# Patient Record
Sex: Female | Born: 1995 | Race: Black or African American | Hispanic: No | Marital: Married | Smoking: Never smoker
Health system: Southern US, Community
[De-identification: ages and names within clinical notes are randomized; demographics above are authoritative.]

## PROBLEM LIST (undated history)

## (undated) ENCOUNTER — Inpatient Hospital Stay (HOSPITAL_COMMUNITY): Payer: Self-pay

## (undated) DIAGNOSIS — Z789 Other specified health status: Secondary | ICD-10-CM

## (undated) HISTORY — DX: Other specified health status: Z78.9

## (undated) HISTORY — PX: NO PAST SURGERIES: SHX2092

---
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
## 2016-01-05 ENCOUNTER — Ambulatory Visit (INDEPENDENT_AMBULATORY_CARE_PROVIDER_SITE_OTHER): Payer: Medicaid Other | Admitting: *Deleted

## 2016-01-05 DIAGNOSIS — Z3042 Encounter for surveillance of injectable contraceptive: Secondary | ICD-10-CM | POA: Diagnosis not present

## 2016-01-05 DIAGNOSIS — Z111 Encounter for screening for respiratory tuberculosis: Secondary | ICD-10-CM | POA: Diagnosis not present

## 2016-01-05 LAB — POCT URINE PREGNANCY: PREG TEST UR: NEGATIVE

## 2016-01-05 MED ORDER — MEDROXYPROGESTERONE ACETATE 150 MG/ML IM SUSP
150.0000 mg | Freq: Once | INTRAMUSCULAR | Status: AC
  Administered 2016-01-05: 150 mg via INTRAMUSCULAR

## 2016-01-05 NOTE — Progress Notes (Signed)
   Pt late for Depo Provera injection. Pregnancy test ordered; result negative.  Pt tolerated Depo injection. Depo given right upper outer quadrant.  Next injection due Oct 27-Nov. 10, 2017.  Reminder card given.   PPD placed Left Forearm.  Pt to return Monday 01/08/16 for reading.  Pt tolerated intradermal injection. Clovis PuMartin, Felicia Both L, RN

## 2016-01-08 ENCOUNTER — Ambulatory Visit (INDEPENDENT_AMBULATORY_CARE_PROVIDER_SITE_OTHER): Payer: Medicaid Other | Admitting: *Deleted

## 2016-01-08 DIAGNOSIS — Z7689 Persons encountering health services in other specified circumstances: Secondary | ICD-10-CM

## 2016-01-08 DIAGNOSIS — Z111 Encounter for screening for respiratory tuberculosis: Secondary | ICD-10-CM

## 2016-01-08 NOTE — Progress Notes (Signed)
PPD Reading Note  PPD read and results entered in EpicCare.  Result: 0 mm induration.  Interpretation: Negative  If test not read within 48-72 hours of initial placement, patient advised to repeat in other arm 1-3 weeks after this test.  Allergic reaction: no

## 2016-04-01 ENCOUNTER — Ambulatory Visit: Payer: Medicaid Other

## 2016-04-04 ENCOUNTER — Ambulatory Visit (INDEPENDENT_AMBULATORY_CARE_PROVIDER_SITE_OTHER): Payer: Medicaid Other | Admitting: *Deleted

## 2016-04-04 DIAGNOSIS — Z3040 Encounter for surveillance of contraceptives, unspecified: Secondary | ICD-10-CM

## 2016-04-04 DIAGNOSIS — Z3042 Encounter for surveillance of injectable contraceptive: Secondary | ICD-10-CM

## 2016-04-04 LAB — POCT URINE PREGNANCY: Preg Test, Ur: NEGATIVE

## 2016-04-04 MED ORDER — MEDROXYPROGESTERONE ACETATE 150 MG/ML IM SUSP
150.0000 mg | Freq: Once | INTRAMUSCULAR | Status: AC
  Administered 2016-04-04: 150 mg via INTRAMUSCULAR

## 2016-04-04 NOTE — Progress Notes (Signed)
   Pt late for Depo Provera injection.  Pregnancy test ordered; results Negative. Patient advised to take another pregnancy test at home or schedule an appointment in clinic next week.  Pt tolerated Depo injection. Depo given left upper outer quadrant.  Next injection due Jan. 25-Feb. 9, 2018.  Reminder card given. Clovis PuMartin, Tamika L, RN

## 2016-05-09 ENCOUNTER — Emergency Department (HOSPITAL_COMMUNITY)
Admission: EM | Admit: 2016-05-09 | Discharge: 2016-05-09 | Disposition: A | Discharge status: Home / Self Care | Payer: Medicaid Other | Attending: Emergency Medicine | Admitting: Emergency Medicine

## 2016-05-09 ENCOUNTER — Emergency Department (HOSPITAL_COMMUNITY): Payer: Medicaid Other

## 2016-05-09 ENCOUNTER — Encounter (HOSPITAL_COMMUNITY): Payer: Self-pay | Admitting: Emergency Medicine

## 2016-05-09 DIAGNOSIS — R55 Syncope and collapse: Secondary | ICD-10-CM

## 2016-05-09 DIAGNOSIS — Z79899 Other long term (current) drug therapy: Secondary | ICD-10-CM | POA: Insufficient documentation

## 2016-05-09 DIAGNOSIS — R0789 Other chest pain: Secondary | ICD-10-CM

## 2016-05-09 DIAGNOSIS — R079 Chest pain, unspecified: Secondary | ICD-10-CM | POA: Diagnosis present

## 2016-05-09 LAB — CBC
HCT: 42.1 % (ref 36.0–46.0)
Hemoglobin: 13.8 g/dL (ref 12.0–15.0)
MCH: 28.8 pg (ref 26.0–34.0)
MCHC: 32.8 g/dL (ref 30.0–36.0)
MCV: 87.9 fL (ref 78.0–100.0)
PLATELETS: 157 10*3/uL (ref 150–400)
RBC: 4.79 MIL/uL (ref 3.87–5.11)
RDW: 11.9 % (ref 11.5–15.5)
WBC: 4.2 10*3/uL (ref 4.0–10.5)

## 2016-05-09 LAB — BASIC METABOLIC PANEL
ANION GAP: 9 (ref 5–15)
BUN: 11 mg/dL (ref 6–20)
CHLORIDE: 107 mmol/L (ref 101–111)
CO2: 24 mmol/L (ref 22–32)
Calcium: 10.1 mg/dL (ref 8.9–10.3)
Creatinine, Ser: 0.91 mg/dL (ref 0.44–1.00)
GFR calc non Af Amer: >60 mL/min (ref >60)
Glucose, Bld: 84 mg/dL (ref 65–99)
POTASSIUM: 3.5 mmol/L (ref 3.5–5.1)
Sodium: 140 mmol/L (ref 135–145)

## 2016-05-09 LAB — I-STAT TROPONIN, ED: Troponin i, poc: 0 ng/mL (ref 0.00–0.08)

## 2016-05-09 LAB — I-STAT BETA HCG BLOOD, ED (MC, WL, AP ONLY): I-stat hCG, quantitative: <5 m[IU]/mL (ref <5)

## 2016-05-09 LAB — D-DIMER, QUANTITATIVE (NOT AT ARMC)

## 2016-05-09 MED ORDER — ONDANSETRON 4 MG PO TBDP
4.0000 mg | ORAL_TABLET | Freq: Once | ORAL | Status: DC
  Filled 2016-05-09: qty 1

## 2016-05-09 NOTE — ED Triage Notes (Signed)
Pt here after having episode of CP/anxiety with tingling all over; pt here for eval and sts still having some CP

## 2016-05-09 NOTE — ED Provider Notes (Signed)
[facility]-EMERG[facility]Y DEPT Provider Note   CSN: [id number] Arrival date & time: 05/09/16  1514  By signing my name below, I, Freida Busmaniana Omoyeni, attest that this documentation has been prepared under the direction and in the presence of Nira ConnPedro Eduardo Janely Gullickson, MD . Electronically Signed: Freida Busmaniana Omoyeni, Scribe. 05/09/2016. 4:44 PM.  History   Chief Complaint Chief Complaint  Patient presents with  . Anxiety  . Chest Pain    The history is provided by the patient and a parent. No language interpreter was used.     HPI Comments:  Margaret PerchesKashawna Pasquarelli is a 20 y.o. female who presents to the Emergency Department complaining of sudden onset, non-radiating, constant central CP which began this afternoon. 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 Pt states she felt hot and like she was going to pass out. She then began to feel like she was breathing funny. She felt like she was in a tunnel; the CP began shortly afterward.  She reports associated SOB at time of onset that has resolved. When the SOB resolved she noticed tingling in her extremities. Pt was evaluated by EMS at the time, they told the pt she was having a panic attack. Pt states she has not had a panic attack before. Pt notes she does not drink as much fluids as she should and she has only eaten a sandwich today.  Pt's mother reports personal h/o a flushed sensation when going long period of time between meals. Pt denies diarrhea, recent fever, cough, congestion, and dysuria. No recent surgeries or long periods of immobilization. Pt denies smoking hx, use of ETOH, and recreational drugs. No FHx of thyroid or cardiac issues. Pt is sexually active; last encounter was ~ 1 month ago. She is currently on depo and uses condoms.   Past Medical History:  Diagnosis Date  . Pilonidal cyst     Patient Active Problem List   Diagnosis Date Noted  . Screening for HIV (human immunodeficiency virus)  12/23/2014  . Depo contraception 12/23/2014  . Motion sickness 01/24/2014  . Menometrorrhagia 02/19/2013  . Well child check 01/08/2012  . Stye 06/27/2011  . Pilonidal cyst 05/15/2011  . Coccyx pain 05/07/2011    History reviewed. No pertinent surgical history.  OB History    No data available       Home Medications    Prior to Admission medications   Medication Sig Start Date End Date Taking? Authorizing Provider  Ibuprofen (ADVIL PO) Take by mouth.    Historical Provider, MD  medroxyPROGESTERone (DEPO-PROVERA) 150 MG/ML injection Inject 150 mg into the muscle every 3 (three) months.    Historical Provider, MD    Family History History reviewed. No pertinent family history.  Social History Social History  Substance Use Topics  . Smoking status: Never Smoker  . Smokeless tobacco: Never Used  . Alcohol use No     Allergies   Penicillins   Review of Systems Review of Systems 10 systems reviewed and all are negative for acute change except as noted in the HPI.  Physical Exam Updated Vital Signs BP 118/79 (BP Location: Right Arm)   Pulse 84   Temp 98.7 F (37.1 C) (Oral)   Resp 18   SpO2 100%   Physical Exam  Constitutional: She is oriented to person, place, and time. She appears well-developed and well-nourished. No distress.  HENT:  Head: Normocephalic and atraumatic.  Nose: Nose normal.  Eyes:  Conjunctivae and EOM are normal. Pupils are equal, round, and reactive to light. Right eye exhibits no discharge. Left eye exhibits no discharge. No scleral icterus.  Neck: Normal range of motion. Neck supple.  Cardiovascular: Normal rate and regular rhythm.  Exam reveals no gallop and no friction rub.   No murmur heard. Pulmonary/Chest: Effort normal and breath sounds normal. No stridor. No respiratory distress. She has no rales.  Abdominal: Soft. She exhibits no distension. There is no tenderness.  Musculoskeletal: She exhibits no edema or tenderness.    Neurological: She is alert and oriented to person, place, and time.  Skin: Skin is warm and dry. No rash noted. She is not diaphoretic. No erythema.  Psychiatric: She has a normal mood and affect.  Nursing note and vitals reviewed.    ED Treatments / Results  DIAGNOSTIC STUDIES:  Oxygen Saturation is 100% on RA, normal by my interpretation.    COORDINATION OF CARE:  4:42 PM Discussed treatment plan with pt at bedside and pt agreed to plan.   Labs (all labs ordered are listed, but only abnormal results are displayed) Labs Reviewed  CBC  BASIC METABOLIC PANEL  D-DIMER, QUANTITATIVE (NOT AT [facility])  D-DIMER, QUANTITATIVE (NOT AT [facility])  I-STAT TROPOININ, ED  I-STAT BETA HCG BLOOD, ED ([facility], [facility], [facility] ONLY)    EKG  EKG Interpretation  Date/Time:  Thursday May 09 2016 15:21:15 EST Ventricular Rate:  89 PR Interval:  154 QRS Duration: 64 QT Interval:  346 QTC Calculation: 420 R Axis:   101 Text Interpretation:  Normal sinus rhythm Rightward axis Artifact No old tracing to compare Confirmed by [facility] MD, Leisl Spurrier [phone]([id number]) on 05/09/2016 4:35:06 PM       Radiology Dg Chest 2 View  Result Date: 05/09/2016 CLINICAL DATA:  Midline chest pain, rapid heart rate come some shortness of breath and lightheadedness EXAM: CHEST  2 VIEW COMPARISON:  None. FINDINGS: No active infiltrate or effusion is seen. Mediastinal and hilar contours are unremarkable. The heart is within normal limits in size. No bony abnormality is seen. IMPRESSION: No active cardiopulmonary disease. Electronically Signed   By: Dwyane DeePaul  Barry M.D.   On: 05/09/2016 15:43    Procedures Procedures (including critical care time)  Medications Ordered in ED Medications  ondansetron (ZOFRAN-ODT) disintegrating tablet 4 mg (not administered)     Initial Impression / Assessment and Plan / ED Course  I have reviewed the triage vital signs and the nursing notes.  Pertinent labs & imaging results that were available during  my care of the patient were reviewed by me and considered in my medical decision making (see chart for details).  Clinical Course     Near syncopal episode with associated atypical chest pain and sob shortly following the episode. EKG without acute ischemic changes or evidence of pericarditis. No evidence of dysrhythmias or prolonged QT interval. Chest x-ray without evidence suggestive of pneumonia, pneumothorax, pneumomediastinum.  No abnormal contour of the mediastinum to suggest dissection. No evidence of acute injuries. Chest pain is highly consistent with ACS. Triage troponin negative. Do not feel that additional troponins would be necessary at this time. Given patient's history of Depo-Provera use with prolonged sitting, we also considered possible pulmonary embolism. D-dimer obtained which was negative, effectively ruling out pulmonary embolism. Presentation is not consistent with aortic dissection or esophageal perforation.  Labs without evidence of anemia, electrolyte derangements. Negative beta hCG.   Feel this is most consistent with vasovagal near syncope.  The patient is safe for  discharge with strict return precautions.   Final Clinical Impressions(s) / ED Diagnoses   Final diagnoses:  Pre-syncope  Atypical chest pain   Disposition: Discharge  Condition: Good  I have discussed the results, Dx and Tx plan with the patient who expressed understanding and agree(s) with the plan. Discharge instructions discussed at great length. The patient was given strict return precautions who verbalized understanding of the instructions. No further questions at time of discharge.    Current Discharge Medication List      Follow Up: primary care provider  Schedule an appointment as soon as possible for a visit  as needed  [facility] [address] [place] [address] [phone] Call  to be set up for  holter monitor   I personally performed the services described in this documentation, which was scribed in my presence. The recorded information has been reviewed and is accurate.        Nira Conn, MD 05/09/16 [phone]

## 2016-05-13 ENCOUNTER — Emergency Department (HOSPITAL_COMMUNITY): Payer: Medicaid Other

## 2016-05-13 ENCOUNTER — Encounter (HOSPITAL_COMMUNITY): Payer: Self-pay | Admitting: Emergency Medicine

## 2016-05-13 DIAGNOSIS — Z5321 Procedure and treatment not carried out due to patient leaving prior to being seen by health care provider: Secondary | ICD-10-CM | POA: Diagnosis not present

## 2016-05-13 DIAGNOSIS — R079 Chest pain, unspecified: Secondary | ICD-10-CM | POA: Insufficient documentation

## 2016-05-13 LAB — BASIC METABOLIC PANEL
Anion gap: 9 (ref 5–15)
BUN: 15 mg/dL (ref 6–20)
CO2: 21 mmol/L — AB (ref 22–32)
Calcium: 9.9 mg/dL (ref 8.9–10.3)
Chloride: 110 mmol/L (ref 101–111)
Creatinine, Ser: 0.82 mg/dL (ref 0.44–1.00)
GFR calc Af Amer: >60 mL/min (ref >60)
GLUCOSE: 82 mg/dL (ref 65–99)
POTASSIUM: 3.9 mmol/L (ref 3.5–5.1)
Sodium: 140 mmol/L (ref 135–145)

## 2016-05-13 LAB — CBC
HEMATOCRIT: 40.7 % (ref 36.0–46.0)
Hemoglobin: 13.5 g/dL (ref 12.0–15.0)
MCH: 29.3 pg (ref 26.0–34.0)
MCHC: 33.2 g/dL (ref 30.0–36.0)
MCV: 88.5 fL (ref 78.0–100.0)
Platelets: 174 10*3/uL (ref 150–400)
RBC: 4.6 MIL/uL (ref 3.87–5.11)
RDW: 11.9 % (ref 11.5–15.5)
WBC: 4.2 10*3/uL (ref 4.0–10.5)

## 2016-05-13 LAB — I-STAT TROPONIN, ED: Troponin i, poc: 0 ng/mL (ref 0.00–0.08)

## 2016-05-13 LAB — D-DIMER, QUANTITATIVE: D-Dimer, Quant: <0.27 ug/mL-FEU (ref 0.00–0.50)

## 2016-05-13 LAB — PREGNANCY, URINE: Preg Test, Ur: NEGATIVE

## 2016-05-13 NOTE — ED Triage Notes (Signed)
Pt having some cp 8/10 mid cp that started today one hour ago no radiating any where, denies any SOB, fever or chills at this time.

## 2016-05-14 ENCOUNTER — Emergency Department (HOSPITAL_COMMUNITY)
Admission: EM | Admit: 2016-05-14 | Discharge: 2016-05-14 | Disposition: A | Discharge status: Home / Self Care | Payer: Medicaid Other | Attending: Emergency Medicine | Admitting: Emergency Medicine

## 2016-05-14 NOTE — ED Notes (Signed)
Pt st's she is feeling better and does not want to stay

## 2016-06-07 ENCOUNTER — Ambulatory Visit: Payer: Medicaid Other | Admitting: Obstetrics and Gynecology

## 2016-06-07 NOTE — Progress Notes (Deleted)
   Subjective:   Patient ID: Margaret Eaton, female    DOB: 09/28/1995, 21 y.o.   MRN: [id number]  Patient presents for Same Day Appointment  No chief complaint on file.   HPI: # ***   Review of Systems   See HPI for ROS.   History  Smoking Status  . Never Smoker  Smokeless Tobacco  . Never Used    Past medical history, surgical, family, and social history reviewed and updated in the EMR as appropriate.  Objective:  There were no vitals taken for this visit. Vitals and nursing note reviewed  Physical Exam  Assessment & Plan:  There are no diagnoses linked to this encounter.    PATIENT EDUCATION PROVIDED: See AVS   Caryl AdaJazma Phelps, DO 06/07/2016, 8:09 AM PGY-3, [facility]

## 2016-08-29 ENCOUNTER — Ambulatory Visit (INDEPENDENT_AMBULATORY_CARE_PROVIDER_SITE_OTHER): Payer: Medicaid Other | Admitting: Family Medicine

## 2016-08-29 ENCOUNTER — Ambulatory Visit: Payer: Medicaid Other | Admitting: Family Medicine

## 2016-09-26 ENCOUNTER — Ambulatory Visit (INDEPENDENT_AMBULATORY_CARE_PROVIDER_SITE_OTHER): Payer: Self-pay | Admitting: *Deleted

## 2016-09-26 DIAGNOSIS — Z3042 Encounter for surveillance of injectable contraceptive: Secondary | ICD-10-CM

## 2016-09-26 LAB — POCT URINE PREGNANCY: Preg Test, Ur: NEGATIVE

## 2016-09-26 MED ORDER — MEDROXYPROGESTERONE ACETATE 150 MG/ML IM SUSP
150.0000 mg | Freq: Once | INTRAMUSCULAR | Status: AC
  Administered 2016-09-26: 150 mg via INTRAMUSCULAR

## 2016-09-26 NOTE — Progress Notes (Signed)
Patient here today for Depo Provera injection.  Last Depo given on 04/04/2016 and is late for Depo.  Obtained urine pregnancy test today and is negative.  Patient states last sexual activity was 4 months ago and did use protection.  Depo given today 09/26/2016.  Site unremarkable & patient tolerated injection.  Next injection due 12/12/2016-12/26/2016 and reminder card given.  Patient instructed to use protection x one week and clinic was out of condoms.  Instructed to return for nurse visit in one week for repeat pregnancy test per office protocol.  Patient verbalized understanding.    Feliz BeamHARTSELL,  JAZMIN, CMA

## 2016-11-04 ENCOUNTER — Telehealth: Payer: Self-pay | Admitting: Family Medicine

## 2016-11-04 ENCOUNTER — Encounter: Payer: Self-pay | Admitting: Family Medicine

## 2016-11-04 ENCOUNTER — Ambulatory Visit (INDEPENDENT_AMBULATORY_CARE_PROVIDER_SITE_OTHER): Payer: Self-pay | Admitting: Family Medicine

## 2016-11-04 DIAGNOSIS — N6320 Unspecified lump in the left breast, unspecified quadrant: Secondary | ICD-10-CM

## 2016-11-04 DIAGNOSIS — N6310 Unspecified lump in the right breast, unspecified quadrant: Secondary | ICD-10-CM | POA: Insufficient documentation

## 2016-11-04 NOTE — Progress Notes (Signed)
    Subjective:  Margaret Eaton is a 21 y.o. female who [facility] with a chief complaint of breast lumps.   HPI:  Breast lumps - Has always had dense breasts with small lumpbs. - Noticed large lumps in both breasts L>R about 1 week ago incidentally while showering.  - No pain or drainage. No redness or warmth. No nipple discharge. - No night sweats, unexplained weight loss - No known fam hx of breast CA  - No recent illnesses. Did get both nipples pierced approximately 1 week ago but no other trauma.   ROS: Per HPI  Objective:  Physical Exam: BP 99/61   Pulse (!) 103   Temp 98.9 F (37.2 C) (Oral)   Wt 101 lb (45.8 kg)   SpO2 99%   BMI 18.47 kg/m   Gen: NAD, resting comfortably CV: RRR with no murmurs appreciated Pulm: NWOB, CTAB with no crackles, wheezes, or rhonchi Breast: multiple well defined mobile masses in bilateral breasts. In L breast can appreciate 4 x 3 cm at 3oclock position. In R breast has 5cm x 3cm area at 8 o clock. No dimpling. No nipple discharge or inversion. No overlying skin changes.   Assessment/Plan:  Bilateral breast lump Given freely mobile discrete masses without red flag symptoms and no fam hx of breast CA, suspect benign breast lesions however given size will need imaging. - Diagnostic b/l breast ultrasound ordered. [facility] via phone while in office today about financial options given is self pay. Is set up to discuss financial counseling with them, was told someone would call her from Breast Center. Patient was given Breast Center contact information and told to follow up with them if she did not hear back. - Counseled patient that will likely need to be followed in 3-4162month interval pending ultrasound results.    Margaret Margaret J Bohdi Leeds, DO PGY-1, [facility] 11/04/2016 2:22 PM

## 2016-11-04 NOTE — Telephone Encounter (Signed)
Given patient may not be able to get breast ultrasound due to cost, discussed back up plan would be for patient to be seen in clinic in 6 weeks if she cannot get imaging so that we can track breast lump size clinically. Patient voiced good understanding.

## 2016-11-04 NOTE — Patient Instructions (Addendum)
It was good to meet you today,  For your breast lumps,  - I suspect that these are benign but it is important to get an ultrasound to be sure. We will schedule you for one. Regardless it is likely that we will need to follow these for a little while.  Someone will call you or send you a letter with the results when they are available. Please give us a call if you do not hear from us after 2 weeks.  Take care and as always you can call us with any questions. Dr. Leland HerElsia J Matty Vanroekel, DO [facility]

## 2016-11-04 NOTE — Assessment & Plan Note (Addendum)
Given freely mobile discrete masses without red flag symptoms and no fam hx of breast CA, suspect benign breast lesions however given size will need imaging. - Diagnostic b/l breast ultrasound ordered. [facility] via phone while in office today about financial options given is self pay. Is set up to discuss financial counseling with them, was told someone would call her from Breast Center. Patient was given Breast Center contact information and told to follow up with them if she did not hear back. - Counseled patient that will likely need to be followed in 3-5248month interval pending ultrasound results.

## 2016-11-06 ENCOUNTER — Telehealth: Payer: Self-pay | Admitting: Family Medicine

## 2016-11-06 NOTE — Telephone Encounter (Signed)
Mother called because she is waiting on a referral to the Breast Center for her daughter. The daughter doesn't have any insurance and not sure which way to go. jw

## 2016-11-06 NOTE — Telephone Encounter (Signed)
Spoke with mother and informed her that the order for the imaging is in EPIC and the breast center doesn't need a referral but can pull her order up in our system.  Mother states that patient doesn't qualify for the breast center scholarship since she isn't 21 years of age yet.  Mother will call the breast center again to schedule an appointment and discuss a payment plan option. Nanetta Wiegman,CMA

## 2016-11-09 NOTE — Progress Notes (Signed)
Opened in Error.

## 2016-12-06 ENCOUNTER — Encounter (HOSPITAL_COMMUNITY): Payer: Self-pay

## 2016-12-06 ENCOUNTER — Emergency Department (HOSPITAL_COMMUNITY)
Admission: EM | Admit: 2016-12-06 | Discharge: 2016-12-06 | Disposition: A | Discharge status: Home / Self Care | Payer: Self-pay | Attending: Emergency Medicine | Admitting: Emergency Medicine

## 2016-12-06 DIAGNOSIS — Z79899 Other long term (current) drug therapy: Secondary | ICD-10-CM | POA: Insufficient documentation

## 2016-12-06 DIAGNOSIS — N6320 Unspecified lump in the left breast, unspecified quadrant: Secondary | ICD-10-CM | POA: Insufficient documentation

## 2016-12-06 NOTE — ED Provider Notes (Signed)
[facility]PT Provider Note   CSN: [id number] Arrival date & time: 12/06/16  1936   By signing my name below, I, Soijett Blue, attest that this documentation has been prepared under the direction and in the presence of Kerrie BuffaloHope Catalena Stanhope, NP Electronically Signed: Soijett Blue, ED Scribe. 12/06/16. 9:51 PM.  History   Chief Complaint Chief Complaint  Patient presents with  . Breast Pain    HPI Margaret Eaton is a 21 y.o. female who presents to the Emergency Department complaining of constant, dull, left breast pain onset last night. She notes that she first noticed intermittent left sided breast pain during her menstrual cycle 2 months ago. She states that she noticed a lump to her left breast last night and that is what prompted her to come into the ED for further evaluation of her symptoms. Pt reports that she has been on depo-provera as her contraceptive method. Pt has not tried any medications for the relief of her symptoms. She notes that she doesn't complete self-breast exams monthly. Pt denies consuming a lot of caffeine. She denies CP, SOB, nausea, vomiting, fever, chills, nipple discharge, and any other symptoms. Patient states she tried to get f/u in the past but the breast center told her she needed to apply to a program and when she went they told her she was not eligible.   The history is provided by the patient. No language interpreter was used.    Past Medical History:  Diagnosis Date  . Pilonidal cyst     Patient Active Problem List   Diagnosis Date Noted  . Bilateral breast lump 11/04/2016  . Depo contraception 12/23/2014  . Motion sickness 01/24/2014  . Menometrorrhagia 02/19/2013  . Stye 06/27/2011  . Pilonidal cyst 05/15/2011  . Coccyx pain 05/07/2011    History reviewed. No pertinent surgical history.  OB History    No data available       Home Medications    Prior to Admission medications   Medication Sig Start Date End Date Taking? Authorizing  Provider  Ibuprofen (ADVIL PO) Take by mouth.    [provider]  medroxyPROGESTERone (DEPO-PROVERA) 150 MG/ML injection Inject 150 mg into the muscle every 3 (three) months.    [provider]    Family History No family history on file.  Social History Social History  Substance Use Topics  . Smoking status: Never Smoker  . Smokeless tobacco: Never Used  . Alcohol use No     Allergies   Penicillins   Review of Systems Review of Systems  Constitutional: Negative for chills and fever.  Respiratory: Negative for shortness of breath.        +left breast pain with lump  Cardiovascular: Negative for chest pain.  Gastrointestinal: Negative for nausea and vomiting.  Skin: Negative for color change and wound.     Physical Exam Updated Vital Signs BP 120/83   Pulse (!) 106   Temp 98.3 F (36.8 C) (Oral)   Resp 20   Ht 5\' 2"  (1.575 m)   Wt 105 lb (47.6 kg)   SpO2 100%   BMI 19.20 kg/m   Physical Exam  Constitutional: She is oriented to person, place, and time. She appears well-developed and well-nourished. No distress.  HENT:  Head: Normocephalic and atraumatic.  Eyes: EOM are normal.  Neck: Neck supple.  Cardiovascular: Normal rate.   Pulmonary/Chest: Effort normal. No respiratory distress. Left breast exhibits mass. Left breast exhibits no inverted nipple and no nipple discharge.  Scribe  chaperone present for exam. 6 cm mass to the left breast that is at 2 o'clock. Minimal tenderness.  Abdominal: She exhibits no distension.  Musculoskeletal: Normal range of motion.  Neurological: She is alert and oriented to person, place, and time.  Skin: Skin is warm and dry.  Psychiatric: She has a normal mood and affect. Her behavior is normal.  Nursing note and vitals reviewed.    ED Treatments / Results  DIAGNOSTIC STUDIES: Oxygen Saturation is 100% on RA, nl by my interpretation.    COORDINATION OF CARE: 9:02 PM Discussed treatment plan with pt at  bedside and pt agreed to plan.  9:49 PM-Case Manager, Burna Mortimer, spoke with pt and will follow up with the patient on Monday 12/09/2016 in regard to further evaluation and treatment at the [facility].   Labs (all labs ordered are listed, but only abnormal results are displayed) Labs Reviewed - No data to display  Radiology No results found.  Procedures Procedures (including critical care time)  Medications Ordered in ED Medications - No data to display   Initial Impression / Assessment and Plan / ED Course  I have reviewed the triage vital signs and the nursing notes. Pt presents with left breast pain.  The patient appears reasonably screened and/or stabilized for discharge and I doubt any other emergent medical condition requiring further screening, evaluation, or treatment in the ED prior to discharge.  Case Manager to set up appointment with the breast center for the patient and will call her with the information.    Final Clinical Impressions(s) / ED Diagnoses   Final diagnoses:  Breast mass, left    New Prescriptions Discharge Medication List as of 12/06/2016  9:49 PM     I personally performed the services described in this documentation, which was scribed in my presence. The recorded information has been reviewed and is accurate.     Kerrie Buffalo Thor[place], [place] 12/07/16 2222    Geoffery Lyons, MD 12/08/16 1538

## 2016-12-06 NOTE — Discharge Instructions (Signed)
You have a mass in your left breast that needs follow up. The Case Manager has started the process to get you in for evaluation. She will call you on Monday 7/13 to let you know when and where to go.

## 2016-12-06 NOTE — Care Management (Signed)
ED CM met with patient concerning assistance with follow up for breast mass. Patient is uninsured. Discussed the [place] program at [facility] [phone] patient is agreeable with referral. CM will contact office on Monday regarding process for applying, and will follow with patient on Monday 7/16.

## 2016-12-06 NOTE — ED Notes (Signed)
Pt understood dc material. NAD noted. 

## 2016-12-06 NOTE — ED Triage Notes (Signed)
Pt states that she started having L breast pain last night, constant dull pain with lump, denies CP or SOB. Denies injury to breast.

## 2016-12-12 NOTE — Care Management (Signed)
ED Referral Summary with Disclaimer  Elaina HoopsOSTANE,Dazha J was seen in the ED and was advised to follow up with you as necessary. It is the sole responsibility of the patient to arrange for an appointment with you or your practice. This notification in no way is meant to establish a doctor-patient relationship   --------------------------------------------------------------------------------------------------- ED REFERRAL SUMMARY DISCLAIMER

## 2016-12-17 ENCOUNTER — Ambulatory Visit: Payer: Medicaid Other

## 2016-12-18 ENCOUNTER — Ambulatory Visit (INDEPENDENT_AMBULATORY_CARE_PROVIDER_SITE_OTHER): Payer: Medicaid Other | Admitting: *Deleted

## 2016-12-18 DIAGNOSIS — Z3042 Encounter for surveillance of injectable contraceptive: Secondary | ICD-10-CM

## 2016-12-18 MED ORDER — MEDROXYPROGESTERONE ACETATE 150 MG/ML IM SUSP
150.0000 mg | Freq: Once | INTRAMUSCULAR | Status: AC
  Administered 2016-12-18: 150 mg via INTRAMUSCULAR

## 2016-12-18 NOTE — Progress Notes (Signed)
    Pt in for Depo Provera injection.  Pt tolerated Depo injection. Depo given right upper outer quadrant.  Next injection due Oct 10-24, 2018.  Reminder card given. Clovis PuMartin, Levie Wages L, RN

## 2016-12-25 ENCOUNTER — Inpatient Hospital Stay (INDEPENDENT_AMBULATORY_CARE_PROVIDER_SITE_OTHER): Payer: Medicaid Other | Admitting: Physician Assistant

## 2017-01-03 ENCOUNTER — Encounter (INDEPENDENT_AMBULATORY_CARE_PROVIDER_SITE_OTHER): Payer: Self-pay

## 2017-03-20 ENCOUNTER — Telehealth (HOSPITAL_COMMUNITY): Payer: Self-pay | Admitting: Family Medicine

## 2017-03-20 ENCOUNTER — Ambulatory Visit (HOSPITAL_COMMUNITY)
Admission: EM | Admit: 2017-03-20 | Discharge: 2017-03-20 | Disposition: A | Discharge status: Home / Self Care | Payer: Self-pay | Attending: Family Medicine | Admitting: Family Medicine

## 2017-03-20 ENCOUNTER — Encounter (HOSPITAL_COMMUNITY): Payer: Self-pay | Admitting: Family Medicine

## 2017-03-20 DIAGNOSIS — Z880 Allergy status to penicillin: Secondary | ICD-10-CM | POA: Insufficient documentation

## 2017-03-20 DIAGNOSIS — J029 Acute pharyngitis, unspecified: Secondary | ICD-10-CM | POA: Insufficient documentation

## 2017-03-20 LAB — POCT RAPID STREP A: Streptococcus, Group A Screen (Direct): NEGATIVE

## 2017-03-20 MED ORDER — CEFDINIR 300 MG PO CAPS: 600.0000 mg | ORAL_CAPSULE | Freq: Every day | ORAL | 0 refills | Status: DC

## 2017-03-20 NOTE — ED Provider Notes (Signed)
  [facility]   [id number] 03/20/17 Arrival Time: 1106   SUBJECTIVE:  Margaret Eaton is a 21 y.o. female who presents to the urgent care with complaint of sore throat.  Two days of soreness without fever.  Penicillin causes hives.  She works as a LawyerCNA    Past Medical History:  Diagnosis Date  . Pilonidal cyst    No family history on file. Social History   Social History  . Marital status: Single    Spouse name: N/A  . Number of children: N/A  . Years of education: N/A   Occupational History  . Not on file.   Social History Main Topics  . Smoking status: Never Smoker  . Smokeless tobacco: Never Used  . Alcohol use No  . Drug use: No  . Sexual activity: Not on file   Other Topics Concern  . Not on file   Social History Narrative  . No narrative on file   No outpatient prescriptions have been marked as taking for the 03/20/17 encounter [facility](Hospital Encounter).   Allergies  Allergen Reactions  . Penicillins Hives    Face and neck      ROS: As per HPI, remainder of ROS negative.   OBJECTIVE:   Vitals:   03/20/17 1201  BP: 120/80  Pulse: 88  Resp: 18  Temp: 98.8 F (37.1 C)  TempSrc: Oral  SpO2: 98%     General appearance: alert; no distress Eyes: PERRL; EOMI; conjunctiva normal HENT: normocephalic; atraumatic; TMs normal, canal normal, external ears normal without trauma; nasal mucosa normal; oral mucosa marked redness of posterior pharynx and swelling of uvula. Neck: supple, no adenopathy Extremities: no cyanosis or edema; symmetrical with no gross deformities Skin: warm and dry Neurologic: normal gait; grossly normal Psychological: alert and cooperative; normal mood and affect      Labs:  Results for orders placed or performed during the hospital encounter of 03/20/17  POCT rapid strep A [facility])  Result Value Ref Range   Streptococcus, Group A Screen (Direct) NEGATIVE NEGATIVE    Labs Reviewed  CULTURE, GROUP A  STREP [facility])  POCT RAPID STREP A    No results found.     ASSESSMENT & PLAN:  1. Acute pharyngitis, unspecified etiology     Meds ordered this encounter  Medications  . cefdinir (OMNICEF) 300 MG capsule    Sig: Take 2 capsules (600 mg total) by mouth daily.    Dispense:  20 capsule    Refill:  0    Reviewed expectations re: course of current medical issues. Questions answered. Outlined signs and symptoms indicating need for more acute intervention. Patient verbalized understanding. After Visit Summary given.    Procedures:      Elvina SidleLauenstein, Emmauel Hallums, MD 03/20/17 1216

## 2017-03-20 NOTE — Discharge Instructions (Signed)
The initial strep test is negative. Therefore we are running a more comprehensive and accurate test that should be back in the next day or so. In the meantime you should start taking the antibiotic prescribed.

## 2017-03-20 NOTE — ED Triage Notes (Signed)
Sore throat for 2-3 days.  Seen by dr Milus Glazierlauenstein prior to staff

## 2017-03-22 LAB — CULTURE, GROUP A STREP (THRC)

## 2017-03-25 ENCOUNTER — Ambulatory Visit: Payer: Self-pay

## 2017-04-08 ENCOUNTER — Ambulatory Visit (INDEPENDENT_AMBULATORY_CARE_PROVIDER_SITE_OTHER): Payer: Medicaid Other | Admitting: *Deleted

## 2017-04-08 DIAGNOSIS — Z3042 Encounter for surveillance of injectable contraceptive: Secondary | ICD-10-CM | POA: Diagnosis not present

## 2017-04-08 DIAGNOSIS — Z3049 Encounter for surveillance of other contraceptives: Secondary | ICD-10-CM

## 2017-04-08 DIAGNOSIS — Z23 Encounter for immunization: Secondary | ICD-10-CM

## 2017-04-08 LAB — POCT URINE PREGNANCY: PREG TEST UR: NEGATIVE

## 2017-04-08 MED ORDER — MEDROXYPROGESTERONE ACETATE 150 MG/ML IM SUSY
150.0000 mg | PREFILLED_SYRINGE | Freq: Once | INTRAMUSCULAR | Status: AC
  Administered 2017-04-08: 150 mg via INTRAMUSCULAR

## 2017-04-08 NOTE — Progress Notes (Signed)
   Patient presents for Depo Provera injection States feeling well Last injection received 12/18/2016  Patient is overdue for injection. Urine pregnancy negative  Depo Provera given IM LUOQ. Patient tolerated well.  Patient states it has been "6 months" since last had unprotected sex Patient aware to use second method of birth control such as condoms and spermicide for next 7 days  Next injection due Jan 29-Jul 08, 2017 Reminder card given  Flu vaccine administered today  Harlo Fabela, Nickola MajorLAURENZE L, RN

## 2017-07-03 ENCOUNTER — Ambulatory Visit (INDEPENDENT_AMBULATORY_CARE_PROVIDER_SITE_OTHER): Payer: Medicaid Other

## 2017-07-03 DIAGNOSIS — Z3049 Encounter for surveillance of other contraceptives: Secondary | ICD-10-CM

## 2017-07-03 DIAGNOSIS — Z3042 Encounter for surveillance of injectable contraceptive: Secondary | ICD-10-CM

## 2017-07-03 MED ORDER — MEDROXYPROGESTERONE ACETATE 150 MG/ML IM SUSP
150.0000 mg | Freq: Once | INTRAMUSCULAR | Status: AC
  Administered 2017-07-03: 150 mg via INTRAMUSCULAR

## 2017-07-03 NOTE — Progress Notes (Signed)
   Patient here for Depo Provera injection within dates. States feeling well.  Last injection 04/08/2017 Next injection 09/18/17-10/02/17. Reminder card given. Injection give IM in RUOQ. Patient tolerated well. Ples SpecterAlisa Brake, RN [facility] RN)

## 2017-10-30 ENCOUNTER — Ambulatory Visit (INDEPENDENT_AMBULATORY_CARE_PROVIDER_SITE_OTHER): Payer: Medicaid Other

## 2017-10-30 DIAGNOSIS — Z3042 Encounter for surveillance of injectable contraceptive: Secondary | ICD-10-CM | POA: Diagnosis not present

## 2017-10-30 DIAGNOSIS — Z308 Encounter for other contraceptive management: Secondary | ICD-10-CM

## 2017-10-30 LAB — POCT URINE PREGNANCY: PREG TEST UR: NEGATIVE

## 2017-10-30 MED ORDER — MEDROXYPROGESTERONE ACETATE 150 MG/ML IM SUSP: 150.0000 mg | Freq: Once | INTRAMUSCULAR | Status: DC

## 2017-10-30 MED ORDER — MEDROXYPROGESTERONE ACETATE 150 MG/ML IM SUSP
150.0000 mg | Freq: Once | INTRAMUSCULAR | Status: AC
  Administered 2017-10-30: 150 mg via INTRAMUSCULAR

## 2017-10-30 NOTE — Progress Notes (Signed)
Patient here today for Depo Provera injection.  Depo given today LUOQ.  Site unremarkable & patient tolerated injection.  Next injection due Aug 22 - Sep 5,2019.  Reminder card given.  Sunday SpillersSharon T Saunders, CMA

## 2017-12-18 ENCOUNTER — Encounter (HOSPITAL_COMMUNITY): Payer: Self-pay | Admitting: Emergency Medicine

## 2017-12-18 ENCOUNTER — Ambulatory Visit (HOSPITAL_COMMUNITY)
Admission: EM | Admit: 2017-12-18 | Discharge: 2017-12-18 | Disposition: A | Discharge status: Home / Self Care | Payer: Self-pay | Attending: Family Medicine | Admitting: Family Medicine

## 2017-12-18 DIAGNOSIS — Z880 Allergy status to penicillin: Secondary | ICD-10-CM | POA: Insufficient documentation

## 2017-12-18 DIAGNOSIS — J029 Acute pharyngitis, unspecified: Secondary | ICD-10-CM | POA: Insufficient documentation

## 2017-12-18 LAB — POCT RAPID STREP A: Streptococcus, Group A Screen (Direct): NEGATIVE

## 2017-12-18 NOTE — ED Triage Notes (Signed)
PT reports sore throat and pain underneath her tongue. PT has a piercing site underneath tongue.

## 2017-12-18 NOTE — Discharge Instructions (Addendum)
You may use over the counter ibuprofen or acetaminophen as needed.  For a sore throat, over the counter products such as Colgate Peroxyl Mouth Sore Rinse or Chloraseptic Sore Throat Spray may provide some temporary relief. Your rapid strep test was negative today. We have sent your throat swab for culture and will let you know of any positive results. 

## 2017-12-21 LAB — CULTURE, GROUP A STREP (THRC)

## 2018-01-01 NOTE — ED Provider Notes (Signed)
[facility] CARE CENTER   [id number] 12/18/17 Arrival Time: 1808  ASSESSMENT & PLAN:  1. Sore throat      Results for orders placed or performed during the hospital encounter of 12/18/17  Culture, group A strep (throat)  Result Value Ref Range   Specimen Description THROAT    Special Requests NONE    Culture      NO GROUP A STREP (S.PYOGENES) ISOLATED Performed at [facility], [address] [place], [place] [address]    Report Status 12/21/2017 FINAL   POCT rapid strep A [facility])  Result Value Ref Range   Streptococcus, Group A Screen (Direct) NEGATIVE NEGATIVE   Labs Reviewed  CULTURE, GROUP A STREP [facility])  POCT RAPID STREP A   OTC analgesics and throat care as needed    Discharge Instructions      You may use over the counter ibuprofen or acetaminophen as needed.   For a sore throat, over the counter products such as Colgate Peroxyl Mouth Sore Rinse or Chloraseptic Sore Throat Spray may provide some temporary relief.  Your rapid strep test was negative today. We have sent your throat swab for culture and will let you know of any positive results.   Reviewed expectations re: course of current medical issues. Questions answered. Outlined signs and symptoms indicating need for more acute intervention. Patient verbalized understanding. After Visit Summary given.   SUBJECTIVE:  Margaret Eaton is a 22 y.o. female who reports a sore throat. Describes as discomfort with swallowing. Tongue also sore. Onset gradual beginning a few days ago. No respiratory symptoms. Normal PO intake but reports discomfort with swallowing. Fever reported: no. No associated n/v/abdominal symptoms. Sick contacts: none.  OTC treatment: none.  ROS: As per HPI.   OBJECTIVE:  Vitals:   12/18/17 1907 12/18/17 1908  BP:  114/86  Pulse:  67  Resp:  16  Temp:  99 F (37.2 C)  TempSrc:  Oral  SpO2:  98%  Weight: 54.4 kg      General appearance: alert; no  distress HEENT: throat with mild erythema and slightly enlarged tonsils; uvula midline yes; tongue piercing without signs of infection Neck: supple with FROM; no lymphadenopathy Lungs: clear to auscultation bilaterally Skin: reveals no rash; warm and dry Psychological: alert and cooperative; normal mood and affect  Allergies  Allergen Reactions  . Penicillins Hives    Face and neck    Past Medical History:  Diagnosis Date  . Pilonidal cyst    Social History   Socioeconomic History  . Marital status: Single    Spouse name: Not on file  . Number of children: Not on file  . Years of education: Not on file  . Highest education level: Not on file  Occupational History  . Not on file  Social Needs  . Financial resource strain: Not on file  . Food insecurity:    Worry: Not on file    Inability: Not on file  . Transportation needs:    Medical: Not on file    Non-medical: Not on file  Tobacco Use  . Smoking status: Never Smoker  . Smokeless tobacco: Never Used  Substance and Sexual Activity  . Alcohol use: No  . Drug use: No  . Sexual activity: Not on file  Lifestyle  . Physical activity:    Days per week: Not on file    Minutes per session: Not on file  . Stress: Not on file  Relationships  . Social  connections:    Talks on phone: Not on file    Gets together: Not on file    Attends religious service: Not on file    Active member of club or organization: Not on file    Attends meetings of clubs or organizations: Not on file    Relationship status: Not on file  . Intimate partner violence:    Fear of current or ex partner: Not on file    Emotionally abused: Not on file    Physically abused: Not on file    Forced sexual activity: Not on file  Other Topics Concern  . Not on file  Social History Narrative  . Not on file   No family history on file.        Mardella LaymanHagler, Nicko Daher, MD 01/01/18 1149

## 2018-04-14 ENCOUNTER — Ambulatory Visit (HOSPITAL_COMMUNITY)
Admission: EM | Admit: 2018-04-14 | Discharge: 2018-04-14 | Disposition: A | Discharge status: Home / Self Care | Payer: Self-pay | Attending: Family Medicine | Admitting: Family Medicine

## 2018-04-14 ENCOUNTER — Encounter (HOSPITAL_COMMUNITY): Payer: Self-pay | Admitting: Emergency Medicine

## 2018-04-14 DIAGNOSIS — Z880 Allergy status to penicillin: Secondary | ICD-10-CM | POA: Insufficient documentation

## 2018-04-14 DIAGNOSIS — J069 Acute upper respiratory infection, unspecified: Secondary | ICD-10-CM | POA: Insufficient documentation

## 2018-04-14 DIAGNOSIS — B9789 Other viral agents as the cause of diseases classified elsewhere: Secondary | ICD-10-CM

## 2018-04-14 DIAGNOSIS — J029 Acute pharyngitis, unspecified: Secondary | ICD-10-CM

## 2018-04-14 DIAGNOSIS — R05 Cough: Secondary | ICD-10-CM

## 2018-04-14 LAB — POCT INFECTIOUS MONO SCREEN: MONO SCREEN: NEGATIVE

## 2018-04-14 LAB — POCT RAPID STREP A: STREPTOCOCCUS, GROUP A SCREEN (DIRECT): NEGATIVE

## 2018-04-14 NOTE — ED Triage Notes (Signed)
Pt sts sore throat and cough x 2 weeks

## 2018-04-14 NOTE — Discharge Instructions (Addendum)
Strep test negative, will send out for culture and we will call you with results Mono was negative Get plenty of rest and push fluids You may use OTC zyrtec and flonase. Use daily for symptomatic relief Drink warm or cool liquids, use throat lozenges, or popsicles to help alleviate symptoms Take OTC ibuprofen or tylenol as needed for pain Follow up with PCP if symptoms persists Return or go to ER if patient has any new or worsening symptoms such as fever, chills, nausea, vomiting, worsening sore throat, cough, abdominal pain, chest pain, changes in bowel or bladder habits, etc...Marland Kitchen

## 2018-04-14 NOTE — ED Provider Notes (Signed)
[facility]   [id number] 04/14/18 Arrival Time: 1751  WU:[id number]:SORE THROAT  SUBJECTIVE: History from: patient.  Carylon PerchesKashawna Raimondo is a 22 y.o. female who presents with abrupt onset of sore throat and dry cough x 2 - 3 weeks.  Denies positive sick exposure or precipitating event.  Has tried OTC medications without relief.  Symptoms are made worse with swallowing, but tolerating liquids and own secretions without difficulty.  Symptoms have remained stable.  Worse at night.  Reports previous symptoms in the past, but not this severe.  Complains of associated fatigue, rhinorrhea, and nasal congestion.  Denies fever, chills, ear pain,  SOB, wheezing, chest pain, nausea, rash, changes in bowel or bladder habits.    Received flu shot this year: no.  ROS: As per HPI.  Past Medical History:  Diagnosis Date  . Pilonidal cyst    History reviewed. No pertinent surgical history. Allergies  Allergen Reactions  . Penicillins Hives    Face and neck   No current facility-administered medications on file prior to encounter.    Current Outpatient Medications on File Prior to Encounter  Medication Sig Dispense Refill  . Ibuprofen (ADVIL PO) Take by mouth.    . medroxyPROGESTERone (DEPO-PROVERA) 150 MG/ML injection Inject 150 mg into the muscle every 3 (three) months.     Social History   Socioeconomic History  . Marital status: Single    Spouse name: Not on file  . Number of children: Not on file  . Years of education: Not on file  . Highest education level: Not on file  Occupational History  . Not on file  Social Needs  . Financial resource strain: Not on file  . Food insecurity:    Worry: Not on file    Inability: Not on file  . Transportation needs:    Medical: Not on file    Non-medical: Not on file  Tobacco Use  . Smoking status: Never Smoker  . Smokeless tobacco: Never Used  Substance and Sexual Activity  . Alcohol use: No  . Drug use: No  . Sexual activity: Not on file   Lifestyle  . Physical activity:    Days per week: Not on file    Minutes per session: Not on file  . Stress: Not on file  Relationships  . Social connections:    Talks on phone: Not on file    Gets together: Not on file    Attends religious service: Not on file    Active member of club or organization: Not on file    Attends meetings of clubs or organizations: Not on file    Relationship status: Not on file  . Intimate partner violence:    Fear of current or ex partner: Not on file    Emotionally abused: Not on file    Physically abused: Not on file    Forced sexual activity: Not on file  Other Topics Concern  . Not on file  Social History Narrative  . Not on file   History reviewed. No pertinent family history.  OBJECTIVE:  Vitals:   04/14/18 1909  BP: 105/70  Pulse: 89  Resp: 18  Temp: 98.3 F (36.8 C)  TempSrc: Oral  SpO2: 95%     General appearance: alert; well appearing, speaking in full sentences and managing own secretions HEENT: NCAT; Ears: EACs clear, TMs pearly gray with visible cone of light, without erythema; Eyes: PERRL, EOMI grossly; Nose: no obvious rhinorrhea; Throat: oropharynx with PND and cobblestoning, tonsils  mildly erythematous without white tonsillar exudates, uvula midline Neck: supple without LAD Lungs: CTA bilaterally without adventitious breath sounds; cough absent Heart: regular rate and rhythm.  Radial pulses 2+ symmetrical bilaterally Skin: warm and dry Psychological: alert and cooperative; normal mood and affect  LABS: Results for orders placed or performed during the hospital encounter of 04/14/18 (from the past 24 hour(s))  POCT rapid strep A [facility])     Status: None   Collection Time: 04/14/18  7:21 PM  Result Value Ref Range   Streptococcus, Group A Screen (Direct) NEGATIVE NEGATIVE  Infectious mono screen, POC     Status: None   Collection Time: 04/14/18  7:52 PM  Result Value Ref Range   Mono Screen NEGATIVE  NEGATIVE     ASSESSMENT & PLAN:  1. Viral URI with cough     Strep test negative, will send out for culture and we will call you with results Mono was negative Get plenty of rest and push fluids You may use OTC zyrtec and flonase. Use daily for symptomatic relief Drink warm or cool liquids, use throat lozenges, or popsicles to help alleviate symptoms Take OTC ibuprofen or tylenol as needed for pain Follow up with PCP if symptoms persists Return or go to ER if patient has any new or worsening symptoms such as fever, chills, nausea, vomiting, worsening sore throat, cough, abdominal pain, chest pain, changes in bowel or bladder habits, etc...  Reviewed expectations re: course of current medical issues. Questions answered. Outlined signs and symptoms indicating need for more acute intervention. Patient verbalized understanding. After Visit Summary given.        Rennis Harding, PA-C 04/14/18 2003

## 2018-04-17 LAB — CULTURE, GROUP A STREP (THRC)

## 2018-04-30 IMAGING — DX DG CHEST 2V
2 series · 2 of 2 positions shown · non-contrast
Comparison: 05/09/2016 chest radiograph

CLINICAL DATA: 20 y/o F; 3 days of the chest pain and shortness of
breath.

EXAM:
CHEST  2 VIEW

[chest pa]
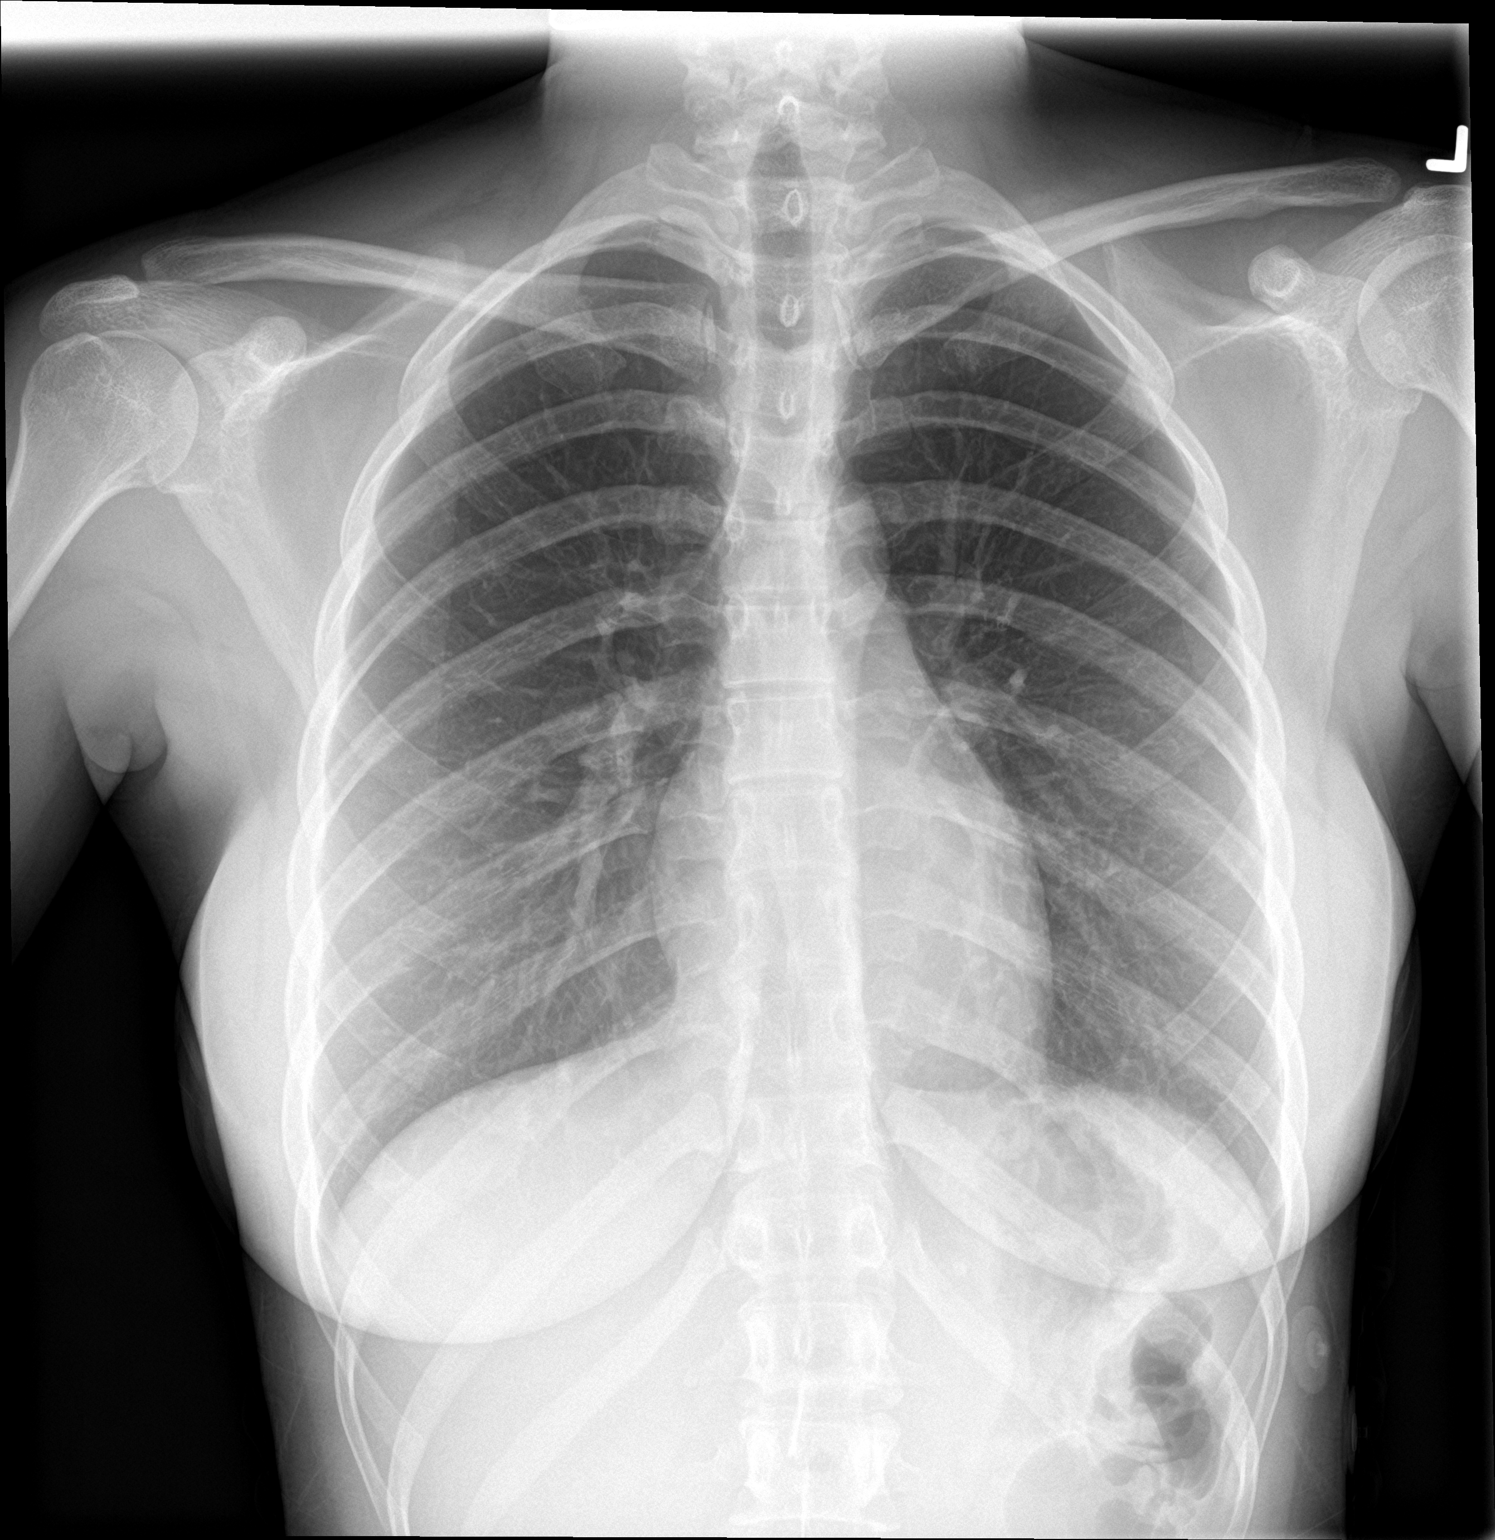

[chest lat]
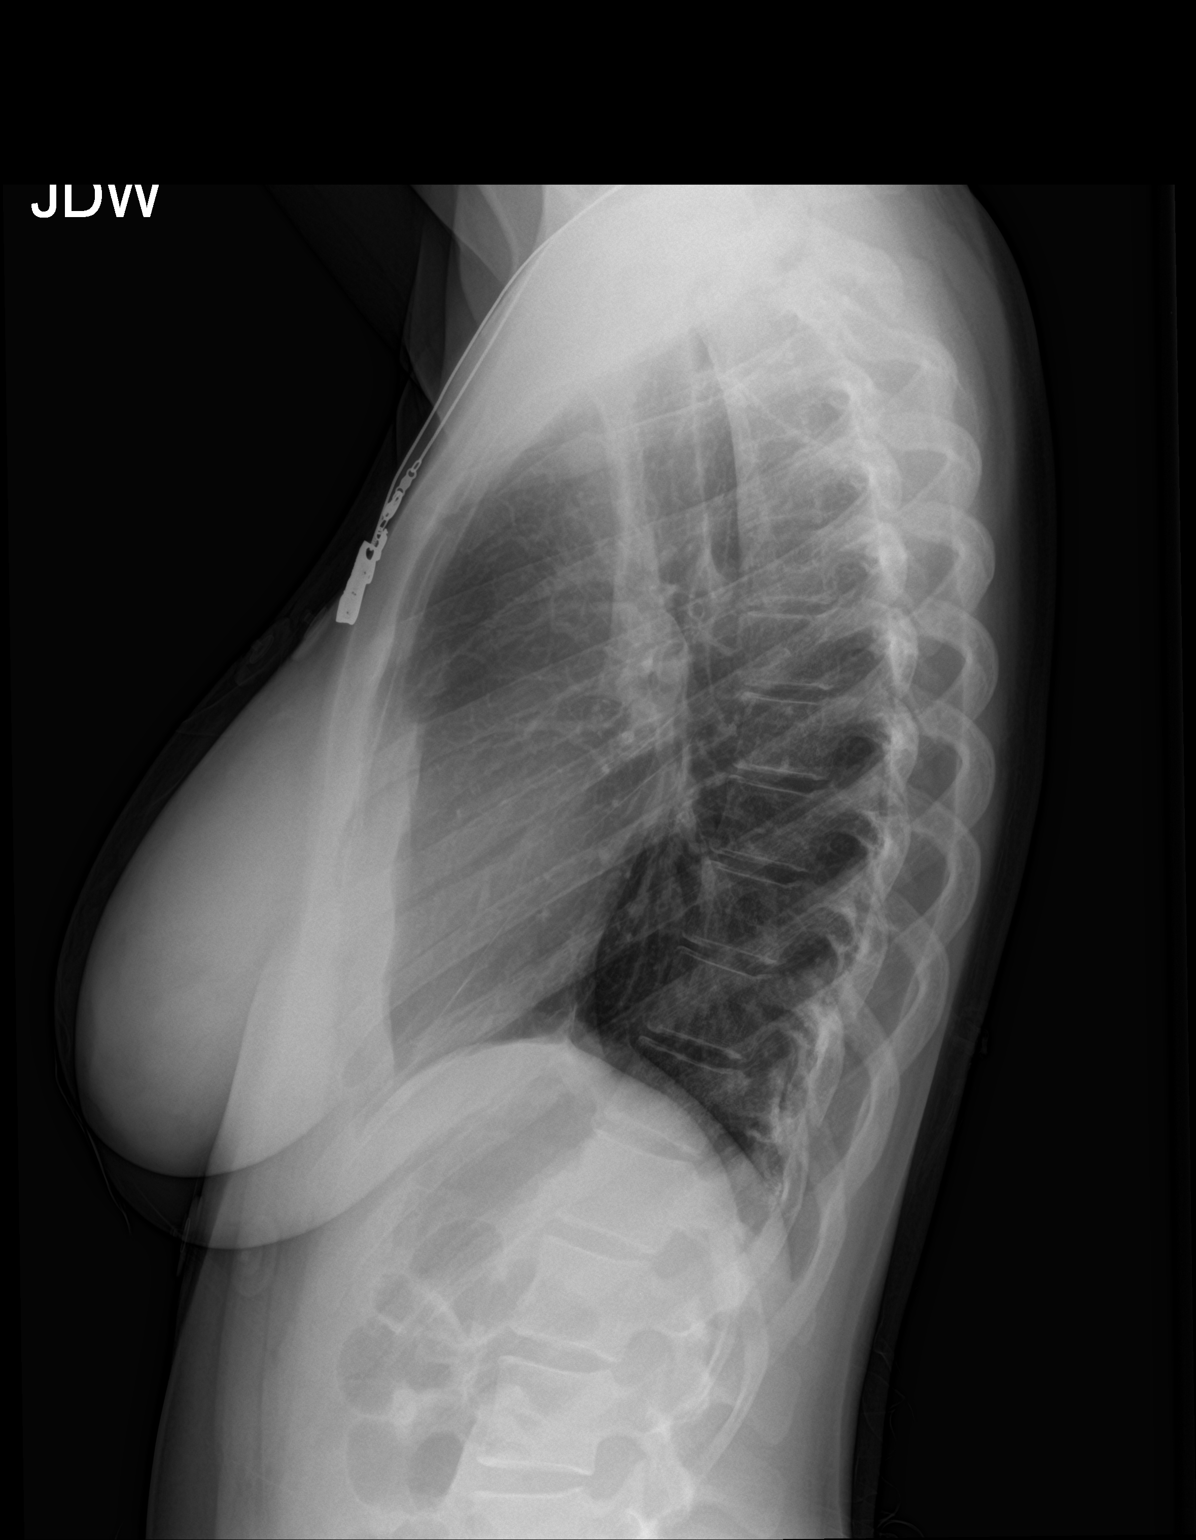

[2 of 2 positions shown; findings below may reference images not displayed]

FINDINGS: The heart size and mediastinal contours are within normal limits and
stable. Both lungs are clear. The visualized skeletal structures are
unremarkable.
IMPRESSION: No active cardiopulmonary disease.

By: Dipak Uriegas M.D.

## 2020-04-28 ENCOUNTER — Other Ambulatory Visit: Payer: Self-pay

## 2020-04-28 ENCOUNTER — Encounter (HOSPITAL_COMMUNITY): Payer: Self-pay | Admitting: *Deleted

## 2020-04-28 ENCOUNTER — Emergency Department (HOSPITAL_COMMUNITY)
Admission: EM | Admit: 2020-04-28 | Discharge: 2020-04-28 | Disposition: A | Discharge status: Home / Self Care | Payer: 59 | Attending: Emergency Medicine | Admitting: Emergency Medicine

## 2020-04-28 DIAGNOSIS — Z79899 Other long term (current) drug therapy: Secondary | ICD-10-CM | POA: Diagnosis not present

## 2020-04-28 DIAGNOSIS — R197 Diarrhea, unspecified: Secondary | ICD-10-CM | POA: Insufficient documentation

## 2020-04-28 DIAGNOSIS — Z20822 Contact with and (suspected) exposure to covid-19: Secondary | ICD-10-CM | POA: Insufficient documentation

## 2020-04-28 DIAGNOSIS — R112 Nausea with vomiting, unspecified: Secondary | ICD-10-CM | POA: Diagnosis not present

## 2020-04-28 DIAGNOSIS — R1110 Vomiting, unspecified: Secondary | ICD-10-CM | POA: Diagnosis present

## 2020-04-28 LAB — RESP PANEL BY RT-PCR (FLU A&B, COVID) ARPGX2
Influenza A by PCR: NEGATIVE
Influenza B by PCR: NEGATIVE
SARS Coronavirus 2 by RT PCR: POSITIVE — AB

## 2020-04-28 MED ORDER — ONDANSETRON 4 MG PO TBDP: 4.0000 mg | ORAL_TABLET | Freq: Three times a day (TID) | ORAL | 0 refills | Status: DC | PRN

## 2020-04-28 MED ORDER — ONDANSETRON 4 MG PO TBDP
4.0000 mg | ORAL_TABLET | Freq: Once | ORAL | Status: AC
  Administered 2020-04-28: 4 mg via ORAL
  Filled 2020-04-28: qty 1

## 2020-04-28 NOTE — ED Notes (Signed)
Date and time results received: 04/28/20 1:59 PM  Test: COVID Critical Value: Positive   Name of Provider Notified: [place]  Orders Received? Or Actions Taken?: none

## 2020-04-28 NOTE — ED Triage Notes (Signed)
Pt with chills and fever on Sunday and yesterday with emesis and diarrhea.  Had covid test done yesterday and results pending.   Pt c/o generalized weakness.  Denies any exposure.

## 2020-04-28 NOTE — ED Provider Notes (Signed)
[facility] EMERGENCY DEPARTMENT Provider Note   CSN: [id number] Arrival date & time: 04/28/20  1003     History Chief Complaint  Patient presents with  . Emesis    Margaret Eaton is a 24 y.o. female.  The history is provided by the patient. No language interpreter was used.  Emesis Severity:  Moderate Number of daily episodes:  2 Quality:  Unable to specify Able to tolerate:  Liquids Progression:  Worsening Relieved by:  Nothing Worsened by:  Nothing Ineffective treatments:  None tried Risk factors: no suspect food intake   Pt complains of vomiting and diarrhea.  Pt reports she has a cough.  Pt reports room mate has a cough as well.      Past Medical History:  Diagnosis Date  . Pilonidal cyst     Patient Active Problem List   Diagnosis Date Noted  . Bilateral breast lump 11/04/2016  . Depo contraception 12/23/2014  . Motion sickness 01/24/2014  . Menometrorrhagia 02/19/2013  . Stye 06/27/2011  . Pilonidal cyst 05/15/2011  . Coccyx pain 05/07/2011    History reviewed. No pertinent surgical history.   OB History   No obstetric history on file.     History reviewed. No pertinent family history.  Social History   Tobacco Use  . Smoking status: Never Smoker  . Smokeless tobacco: Never Used  Substance Use Topics  . Alcohol use: No  . Drug use: No    Home Medications Prior to Admission medications   Medication Sig Start Date End Date Taking? Authorizing Provider  Ibuprofen (ADVIL PO) Take by mouth.    [provider]  medroxyPROGESTERone (DEPO-PROVERA) 150 MG/ML injection Inject 150 mg into the muscle every 3 (three) months.    [provider]  ondansetron (ZOFRAN ODT) 4 MG disintegrating tablet Take 1 tablet (4 mg total) by mouth every 8 (eight) hours as needed for nausea or vomiting. 04/28/20   Elson Areas, PA-C    Allergies    Penicillins  Review of Systems   Review of Systems  Gastrointestinal: Positive for vomiting.    All other systems reviewed and are negative.   Physical Exam Updated Vital Signs BP 116/80   Pulse 96   Temp 98.9 F (37.2 C)   Resp 17   Ht 5\' 3"  (1.6 m)   Wt 47.6 kg   LMP 04/21/2020   SpO2 100%   BMI 18.60 kg/m   Physical Exam Vitals and nursing note reviewed.  Constitutional:      Appearance: She is well-developed.  HENT:     Head: Normocephalic.  Cardiovascular:     Rate and Rhythm: Normal rate.     Pulses: Normal pulses.  Pulmonary:     Effort: Pulmonary effort is normal.  Abdominal:     General: There is no distension.  Musculoskeletal:        General: Normal range of motion.     Cervical back: Normal range of motion.  Neurological:     General: No focal deficit present.     Mental Status: She is alert and oriented to person, place, and time.  Psychiatric:        Mood and Affect: Mood normal.     ED Results / Procedures / Treatments   Labs (all labs ordered are listed, but only abnormal results are displayed) Labs Reviewed  RESP PANEL BY RT-PCR (FLU A&B, COVID) ARPGX2 - Abnormal; Notable for the following components:      Result Value  SARS Coronavirus 2 by RT PCR POSITIVE (*)    All other components within normal limits    EKG None  Radiology No results found.  Procedures Procedures (including critical care time)  Medications Ordered in ED Medications  ondansetron (ZOFRAN-ODT) disintegrating tablet 4 mg (4 mg Oral Given 04/28/20 1341)    ED Course  I have reviewed the triage vital signs and the nursing notes.  Pertinent labs & imaging results that were available during my care of the patient were reviewed by me and considered in my medical decision making (see chart for details).    MDM Rules/Calculators/A&P                          MDM:  Pt given zofran odt.  Pt able to tolerate po fluids.  Covid returned and is normal.  Final Clinical Impression(s) / ED Diagnoses Final diagnoses:  Vomiting, intractability of vomiting not  specified, presence of nausea not specified, unspecified vomiting type    Rx / DC Orders ED Discharge Orders         Ordered    ondansetron (ZOFRAN ODT) 4 MG disintegrating tablet  Every 8 hours PRN,   Status:  Discontinued        04/28/20 1413    ondansetron (ZOFRAN ODT) 4 MG disintegrating tablet  Every 8 hours PRN        04/28/20 1436        An After Visit Summary was printed and given to the patient.   Elson Areas, [place] 04/28/20 1737    Bethann Berkshire, MD 04/30/20 [phone]

## 2020-04-28 NOTE — Discharge Instructions (Signed)
Your covid test is positive  

## 2020-04-29 ENCOUNTER — Telehealth (HOSPITAL_COMMUNITY): Payer: Self-pay | Admitting: Nurse Practitioner

## 2020-04-29 DIAGNOSIS — U071 COVID-19: Secondary | ICD-10-CM

## 2020-04-29 NOTE — Telephone Encounter (Signed)
Called to Discuss with patient about Covid symptoms and the use of a monoclonal antibody infusion for those with mild to moderate Covid symptoms and at a high risk of hospitalization.     Pt is qualified for this infusion at the [place] infusion center due to co-morbid conditions and/or a member of an at-risk group.     Unable to reach pt. Left message to return call.   Dyane Broberg, DNP, AGNP-C [phone] (Infusion Center Hotline)  

## 2020-07-25 DIAGNOSIS — Z419 Encounter for procedure for purposes other than remedying health state, unspecified: Secondary | ICD-10-CM | POA: Diagnosis not present

## 2020-08-25 DIAGNOSIS — Z419 Encounter for procedure for purposes other than remedying health state, unspecified: Secondary | ICD-10-CM | POA: Diagnosis not present

## 2020-09-24 DIAGNOSIS — Z419 Encounter for procedure for purposes other than remedying health state, unspecified: Secondary | ICD-10-CM | POA: Diagnosis not present

## 2020-10-25 DIAGNOSIS — Z419 Encounter for procedure for purposes other than remedying health state, unspecified: Secondary | ICD-10-CM | POA: Diagnosis not present

## 2020-11-24 DIAGNOSIS — Z419 Encounter for procedure for purposes other than remedying health state, unspecified: Secondary | ICD-10-CM | POA: Diagnosis not present

## 2020-12-25 DIAGNOSIS — Z419 Encounter for procedure for purposes other than remedying health state, unspecified: Secondary | ICD-10-CM | POA: Diagnosis not present

## 2021-01-25 DIAGNOSIS — Z419 Encounter for procedure for purposes other than remedying health state, unspecified: Secondary | ICD-10-CM | POA: Diagnosis not present

## 2021-02-24 DIAGNOSIS — Z419 Encounter for procedure for purposes other than remedying health state, unspecified: Secondary | ICD-10-CM | POA: Diagnosis not present

## 2021-03-27 DIAGNOSIS — Z419 Encounter for procedure for purposes other than remedying health state, unspecified: Secondary | ICD-10-CM | POA: Diagnosis not present

## 2021-04-26 DIAGNOSIS — Z419 Encounter for procedure for purposes other than remedying health state, unspecified: Secondary | ICD-10-CM | POA: Diagnosis not present

## 2021-05-27 DIAGNOSIS — Z419 Encounter for procedure for purposes other than remedying health state, unspecified: Secondary | ICD-10-CM | POA: Diagnosis not present

## 2021-06-27 DIAGNOSIS — Z419 Encounter for procedure for purposes other than remedying health state, unspecified: Secondary | ICD-10-CM | POA: Diagnosis not present

## 2021-07-25 DIAGNOSIS — Z419 Encounter for procedure for purposes other than remedying health state, unspecified: Secondary | ICD-10-CM | POA: Diagnosis not present

## 2021-08-25 DIAGNOSIS — Z419 Encounter for procedure for purposes other than remedying health state, unspecified: Secondary | ICD-10-CM | POA: Diagnosis not present

## 2021-09-24 DIAGNOSIS — Z419 Encounter for procedure for purposes other than remedying health state, unspecified: Secondary | ICD-10-CM | POA: Diagnosis not present

## 2021-10-25 DIAGNOSIS — Z419 Encounter for procedure for purposes other than remedying health state, unspecified: Secondary | ICD-10-CM | POA: Diagnosis not present

## 2021-11-24 DIAGNOSIS — Z419 Encounter for procedure for purposes other than remedying health state, unspecified: Secondary | ICD-10-CM | POA: Diagnosis not present

## 2021-12-25 DIAGNOSIS — Z419 Encounter for procedure for purposes other than remedying health state, unspecified: Secondary | ICD-10-CM | POA: Diagnosis not present

## 2022-01-25 DIAGNOSIS — Z419 Encounter for procedure for purposes other than remedying health state, unspecified: Secondary | ICD-10-CM | POA: Diagnosis not present

## 2022-02-24 DIAGNOSIS — Z419 Encounter for procedure for purposes other than remedying health state, unspecified: Secondary | ICD-10-CM | POA: Diagnosis not present

## 2022-03-27 DIAGNOSIS — Z419 Encounter for procedure for purposes other than remedying health state, unspecified: Secondary | ICD-10-CM | POA: Diagnosis not present

## 2022-04-26 DIAGNOSIS — Z419 Encounter for procedure for purposes other than remedying health state, unspecified: Secondary | ICD-10-CM | POA: Diagnosis not present

## 2022-05-27 DIAGNOSIS — Z419 Encounter for procedure for purposes other than remedying health state, unspecified: Secondary | ICD-10-CM | POA: Diagnosis not present

## 2022-06-27 DIAGNOSIS — Z419 Encounter for procedure for purposes other than remedying health state, unspecified: Secondary | ICD-10-CM | POA: Diagnosis not present

## 2022-07-26 DIAGNOSIS — Z419 Encounter for procedure for purposes other than remedying health state, unspecified: Secondary | ICD-10-CM | POA: Diagnosis not present

## 2022-11-25 DIAGNOSIS — Z419 Encounter for procedure for purposes other than remedying health state, unspecified: Secondary | ICD-10-CM | POA: Diagnosis not present

## 2022-12-12 ENCOUNTER — Telehealth: Payer: Self-pay | Admitting: Family Medicine

## 2022-12-12 NOTE — Telephone Encounter (Signed)
Vida Roller is the daughter of one of MD's current patients, Gasper Lloyd  MRN: [id number]   Ms. Crenshaw called to say MD told Ms. Broadnax that although MD is booked out until January 2025 for a New Patient Establishment appointment,   MD would open up a slot so that Ms. Bartha can be seen sooner by MD.  Please advise.

## 2022-12-12 NOTE — Telephone Encounter (Signed)
Pt will be sch with dr banks on 12-18-2022 2 pm

## 2022-12-18 ENCOUNTER — Encounter: Payer: Self-pay | Admitting: Family Medicine

## 2022-12-18 ENCOUNTER — Ambulatory Visit (INDEPENDENT_AMBULATORY_CARE_PROVIDER_SITE_OTHER): Payer: Medicaid Other | Admitting: Family Medicine

## 2022-12-18 VITALS — BP 110/60 | HR 86 | Temp 98.8°F | Ht 63.0 in | Wt 104.8 lb

## 2022-12-18 DIAGNOSIS — Z7689 Persons encountering health services in other specified circumstances: Secondary | ICD-10-CM

## 2022-12-18 DIAGNOSIS — R110 Nausea: Secondary | ICD-10-CM

## 2022-12-18 DIAGNOSIS — Z3A10 10 weeks gestation of pregnancy: Secondary | ICD-10-CM | POA: Diagnosis not present

## 2022-12-18 LAB — POC URINALSYSI DIPSTICK (AUTOMATED)
Bilirubin, UA: NEGATIVE
Blood, UA: NEGATIVE
Glucose, UA: NEGATIVE
Ketones, UA: NEGATIVE
Leukocytes, UA: NEGATIVE
Nitrite, UA: NEGATIVE
Protein, UA: NEGATIVE
Spec Grav, UA: >=1.03 — AB (ref 1.010–1.025)
Urobilinogen, UA: 0.2 E.U./dL
pH, UA: 6 (ref 5.0–8.0)

## 2022-12-18 NOTE — Progress Notes (Signed)
Established Patient Office Visit   Subjective  Patient ID: Margaret Eaton, female    DOB: Jun 27, 1995  Age: 27 y.o. MRN: [id number]  Chief Complaint  Patient presents with   Establish Care    Patient presents to establish care, is pregnant and states she does not have an OB/GYN  Pt is accompanied by her husband Margaret Eaton, and her mother, Margaret Eaton.  Patient is a 27 year old G1P0 at ~10 wks female with no prior PCP seen for establish care and ongoing concerns.  Patient denies major health problems and is not currently taking any medications other than PNV's.  Pregnancy: Patient is a G1P0 with LMP 10/09/2022.  Has yet to find an OB/GYN to establish with.  Taking PNV's daily.  Notes daily nausea.  Not really having emesis.  Only drinking about 2 bottles of water per day.  Allergies: Penicillin-hives  Social history: Patient is married.  She is currently working as a Water engineer.  She completed her associates degree.  Patient denies alcohol, tobacco, drug use.    Past Medical History:  Diagnosis Date   Pilonidal cyst    History reviewed. No pertinent surgical history. Social History   Tobacco Use   Smoking status: Never   Smokeless tobacco: Never  Vaping Use   Vaping status: Never Used  Substance Use Topics   Alcohol use: No   Drug use: Never   History reviewed. No pertinent family history. Allergies  Allergen Reactions   Penicillins Hives    Face and neck      ROS Negative unless stated above    Objective:     BP 110/60 (BP Location: Left Arm, Patient Position: Sitting, Cuff Size: Normal)   Pulse 86   Temp 98.8 F (37.1 C) (Oral)   Ht 5\' 3"  (1.6 m)   Wt 104 lb 12.8 oz (47.5 kg)   LMP 10/09/2022 (Exact Date)   SpO2 98%   BMI 18.56 kg/m  BP Readings from Last 3 Encounters:  12/18/22 110/60  04/28/20 116/80  04/14/18 105/70   Wt Readings from Last 3 Encounters:  12/18/22 104 lb 12.8 oz (47.5 kg)  04/28/20 105 lb (47.6 kg)  12/18/17 120 lb (54.4 kg)       Physical Exam Constitutional:      General: She is not in acute distress.    Appearance: Normal appearance.  HENT:     Head: Normocephalic and atraumatic.     Nose: Nose normal.     Mouth/Throat:     Mouth: Mucous membranes are moist.  Eyes:     Extraocular Movements: Extraocular movements intact.     Conjunctiva/sclera: Conjunctivae normal.  Cardiovascular:     Rate and Rhythm: Normal rate and regular rhythm.     Heart sounds: Normal heart sounds. No murmur heard.    No gallop.  Pulmonary:     Effort: Pulmonary effort is normal. No respiratory distress.     Breath sounds: Normal breath sounds. No wheezing, rhonchi or rales.  Musculoskeletal:        General: Normal range of motion.     Right lower leg: No edema.     Left lower leg: No edema.  Skin:    General: Skin is warm and dry.  Neurological:     Mental Status: She is alert and oriented to person, place, and time. Mental status is at baseline.     Results for orders placed or performed in visit on 12/18/22  POCT Urinalysis Dipstick (Automated)  Result Value  Ref Range   Color, UA yellow    Clarity, UA clear    Glucose, UA Negative Negative   Bilirubin, UA negative    Ketones, UA negative    Spec Grav, UA >=1.030 (A) 1.010 - 1.025   Blood, UA negative    pH, UA 6.0 5.0 - 8.0   Protein, UA Negative Negative   Urobilinogen, UA 0.2 0.2 or 1.0 E.U./dL   Nitrite, UA negative    Leukocytes, UA Negative Negative      Assessment & Plan:  [redacted] weeks gestation of pregnancy -LMP 10/09/2022 -Given info for area OB providers to establish care -Reviewed medications safe in pregnancy. -Patient encouraged to increase p.o. intake of water -     POCT urinalysis dipstick  Nausea -2/2 morning sickness -Discussed taking vitamin B6 50 mg and half tab Unisom twice a day. Parke Simmers foods and small meals throughout the day  Encounter to establish care -We reviewed the PMH, PSH, FH, SH, Meds and Allergies. -We provided refills  for any medications we will prescribe as needed. -We addressed current concerns per orders and patient instructions. -We have asked for records for pertinent exams, studies, vaccines and notes from previous providers. -We have advised patient to follow up per instructions below.   Return if symptoms worsen or fail to improve.   Deeann Saint, MD

## 2022-12-18 NOTE — Patient Instructions (Addendum)
Here is a list of some of the area OB/Gyn providers.  It is not an all inclusive list but should help you get started.  For nausea you can take vitamin B6 (50 mg daily) and Unisom (half tablet twice a day).  Eating a bland diet may also help.  Continue taking prenatal vitamins.  Below are names of several OB/GYN providers that you can look into and contact to set up an appointment.   [facility] -Jaymes Graff, MD  -Hoover Browns, MD - Osborn Coho, MD -Marline Backbone, MD ---Dierdre Forth, MD is listed on their site, but I'm pretty sure she retired.  [facility] -Ellison Hughs, MD -Pryor Ochoa, DO  [facility] -Gerald Leitz, MD  -Steva Ready, DO  [facility] -Derl Barrow, MD -Marlow Baars, MD  [facility] Shea Evans, MD  [facility] Leda Quail, MD ---only does gynecology now, not obstetrics.

## 2022-12-26 DIAGNOSIS — Z419 Encounter for procedure for purposes other than remedying health state, unspecified: Secondary | ICD-10-CM | POA: Diagnosis not present

## 2023-01-08 ENCOUNTER — Ambulatory Visit (INDEPENDENT_AMBULATORY_CARE_PROVIDER_SITE_OTHER): Payer: Medicaid Other

## 2023-01-08 ENCOUNTER — Telehealth (INDEPENDENT_AMBULATORY_CARE_PROVIDER_SITE_OTHER): Payer: Medicaid Other | Admitting: General Practice

## 2023-01-08 DIAGNOSIS — Z3400 Encounter for supervision of normal first pregnancy, unspecified trimester: Secondary | ICD-10-CM | POA: Insufficient documentation

## 2023-01-08 DIAGNOSIS — Z3491 Encounter for supervision of normal pregnancy, unspecified, first trimester: Secondary | ICD-10-CM

## 2023-01-08 DIAGNOSIS — Z3A12 12 weeks gestation of pregnancy: Secondary | ICD-10-CM

## 2023-01-08 DIAGNOSIS — Z3401 Encounter for supervision of normal first pregnancy, first trimester: Secondary | ICD-10-CM

## 2023-01-08 DIAGNOSIS — Z3A00 Weeks of gestation of pregnancy not specified: Secondary | ICD-10-CM

## 2023-01-08 MED ORDER — DOXYLAMINE-PYRIDOXINE 10-10 MG PO TBEC: DELAYED_RELEASE_TABLET | ORAL | 6 refills | Status: DC

## 2023-01-08 NOTE — Progress Notes (Signed)
New OB Intake  I connected with Margaret Eaton  on 01/08/23 at 10:15 AM EDT by MyChart Video Visit and verified that I am speaking with the correct person using two identifiers. Nurse is located at [facility] and pt is located at home.  I discussed the limitations, risks, security and privacy concerns of performing an evaluation and management service by telephone and the availability of in person appointments. I also discussed with the patient that there may be a patient responsible charge related to this service. The patient expressed understanding and agreed to proceed.  I explained I am completing New OB Intake today. We discussed EDD of 07/16/2023, by Last Menstrual Period. Pt is G2P0010. I reviewed her allergies, medications and Medical/Surgical/OB history.    Patient Active Problem List   Diagnosis Date Noted   Supervision of low-risk first pregnancy 01/08/2023    Concerns addressed today -patient reports noticing racing heart beat since last week with minimal exertion. She also reports a lot of nausea/vomiting- has tried ginger chews and OTC Unisom with Vitamin B6 with minimal relief. Diclegis sent to pharmacy per protocol.  Delivery Plans Plans to deliver at [facility]. Discussed the nature of our practice with multiple providers including residents and students. Due to the size of the practice, the delivering provider may not be the same as those providing prenatal care.    MyChart/Babyscripts MyChart access verified. I explained pt will have some visits in office and some virtually. Babyscripts instructions given and order placed. Patient verifies receipt of registration text/e-mail. Account successfully created and app downloaded.  Anatomy US -will be scheduled at new OB appt    Is patient a Mom+Baby Combined Care candidate?  Accepted   If accepted, confirm patient does not intend to move from the area for at least 12 months, then notify Mom+Baby staff  Interested in [place]? If  yes, send referral and doula dot phrase.  Yes, referral placed  Is patient a candidate for Babyscripts Optimization? No - [facility]  First visit review I reviewed new OB appt with patient. Explained pt will be seen by [facility] provider at first visit. Routine prenatal labs will be drawn at that time. Ask about Avelina Laine genetic testing at that time.   Last Pap Never had pap- will be done at new OB visit.  Marylynn Pearson, RN 01/08/2023  11:02 AM

## 2023-01-09 ENCOUNTER — Encounter: Payer: Self-pay | Admitting: General Practice

## 2023-01-22 ENCOUNTER — Other Ambulatory Visit: Payer: Self-pay

## 2023-01-22 ENCOUNTER — Other Ambulatory Visit (HOSPITAL_COMMUNITY)
Admission: RE | Admit: 2023-01-22 | Discharge: 2023-01-22 | Disposition: A | Discharge status: Home / Self Care | Payer: Medicaid Other | Source: Ambulatory Visit | Attending: Family Medicine | Admitting: Family Medicine

## 2023-01-22 ENCOUNTER — Ambulatory Visit (INDEPENDENT_AMBULATORY_CARE_PROVIDER_SITE_OTHER): Payer: Medicaid Other | Admitting: Family Medicine

## 2023-01-22 VITALS — BP 117/76 | HR 67 | Wt 105.2 lb

## 2023-01-22 DIAGNOSIS — Z3401 Encounter for supervision of normal first pregnancy, first trimester: Secondary | ICD-10-CM | POA: Diagnosis not present

## 2023-01-22 NOTE — Progress Notes (Signed)
INITIAL PRENATAL VISIT  Subjective:   Margaret Eaton is being seen today for her first obstetrical visit.  This is a planned pregnancy. This is a desired pregnancy.  She is at [redacted]w[redacted]d gestation by LMP. Her obstetrical history is significant for  No complications . Relationship with FOB: significant other, living together. Patient does intend to breast feed. Pregnancy history fully reviewed.  Patient reports no complaints.  Indications for ASA therapy (per uptodate) One of the following: Previous pregnancy with preeclampsia, especially early onset and with an adverse outcome No Multifetal gestation No Chronic hypertension No Type 1 or 2 diabetes mellitus No Chronic kidney disease No Autoimmune disease (antiphospholipid syndrome, systemic lupus erythematosus) No  Two or more of the following: Nulliparity Yes Obesity (body mass index >30 kg/m2) No Family history of preeclampsia in mother or sister No Age ?35 years No Sociodemographic characteristics (African American race, low socioeconomic level) Yes Personal risk factors (eg, previous pregnancy with low birth weight or small for gestational age infant, previous adverse pregnancy outcome [eg, stillbirth], interval >10 years between pregnancies) No  Indications for early GDM screening  First-degree relative with diabetes Yes BMI >30kg/m2 No Age > 25 Yes Early screening tests: A1C   Review of Systems:   Review of Systems  Objective:    Obstetric History OB History  Gravida Para Term Preterm AB Living  2 0 0 0 1 0  SAB IAB Ectopic Multiple Live Births  0 1 0 0 0    # Outcome Date GA Lbr Len/2nd Weight Sex Type Anes PTL Lv  2 Current           1 IAB             Past Medical History:  Diagnosis Date   Medical history non-contributory    Menometrorrhagia 02/19/2013   Pilonidal cyst     Past Surgical History:  Procedure Laterality Date   NO PAST SURGERIES      Current Outpatient Medications on File Prior to  Visit  Medication Sig Dispense Refill   Doxylamine-Pyridoxine (DICLEGIS) 10-10 MG TBEC Take 2 tabs at bedtime, can add 1 tab in AM and 1 tab in afternoon 100 tablet 6   Prenatal Vit-Fe Fumarate-FA (PRENATAL PO) Take by mouth daily.     No current facility-administered medications on file prior to visit.    Allergies  Allergen Reactions   Penicillins Hives    Face and neck    Social History:  reports that she has never smoked. She has never used smokeless tobacco. She reports that she does not drink alcohol and does not use drugs.  Family History  Problem Relation Age of Onset   Diabetes Mother    Aplastic anemia Father    Hyperlipidemia Maternal Grandmother    Atrial fibrillation Maternal Grandmother     The following portions of the patient's history were reviewed and updated as appropriate: allergies, current medications, past family history, past medical history, past social history, past surgical history and problem list.  Review of Systems Review of Systems  Constitutional:  Negative for chills and fever.  HENT:  Negative for congestion and sore throat.   Eyes:  Negative for pain and visual disturbance.  Respiratory:  Negative for cough, chest tightness and shortness of breath.   Cardiovascular:  Negative for chest pain.  Gastrointestinal:  Negative for abdominal pain, diarrhea, nausea and vomiting.  Endocrine: Negative for cold intolerance and heat intolerance.  Genitourinary:  Negative for dysuria and flank pain.  Musculoskeletal:  Negative for back pain.  Skin:  Negative for rash.  Allergic/Immunologic: Negative for food allergies.  Neurological:  Negative for dizziness and light-headedness.  Psychiatric/Behavioral:  Negative for agitation.       Physical Exam:  BP 117/76   Pulse 67   Wt 105 lb 3.2 oz (47.7 kg)   LMP 10/09/2022 (Within Days)   BMI 18.64 kg/m  CONSTITUTIONAL: Well-developed, well-nourished female in no acute distress.  HENT:  Normocephalic,  atraumatic.  Oropharynx is clear and moist EYES: Conjunctivae normal. No scleral icterus.  NECK: Normal range of motion, supple, no masses.  Normal thyroid.  SKIN: Skin is warm and dry. No rash noted. Not diaphoretic. No erythema. No pallor. MUSCULOSKELETAL: Normal range of motion. No tenderness.  No cyanosis, clubbing, or edema.   NEUROLOGIC: Alert and oriented to person, place, and time. Normal muscle tone coordination.  PSYCHIATRIC: Normal mood and affect. Normal behavior. Normal judgment and thought content. CARDIOVASCULAR: Normal heart rate noted, regular rhythm RESPIRATORY: Clear to auscultation bilaterally. Effort and breath sounds normal, no problems with respiration noted. BREASTS: Symmetric in size. No masses, skin changes, nipple drainage, or lymphadenopathy. ABDOMEN: Soft, normal bowel sounds, no distention noted.  No tenderness, rebound or guarding. Fundal ht: 15 PELVIC: Normal appearing external genitalia; normal appearing vaginal mucosa and cervix.  No abnormal discharge noted.  Pap smear obtained.  Normal uterine size, no other palpable masses, no uterine or adnexal tenderness.  Fetal Heart Rate (bpm): 148           Assessment:    Pregnancy: G2P0010  1. Encounter for supervision of low-risk first pregnancy in first trimester - CBC/D/Plt+RPR+Rh+ABO+RubIgG... - Culture, OB Urine - Hemoglobin A1c - Cytology - PAP( [place]) - PANORAMA PRENATAL TEST - [place] Basic Panel     Plan:     Initial labs drawn. Prenatal vitamins. Problem list reviewed and updated. Reviewed in detail the nature of the practice with collaborative care between  Genetic screening discussed: NIPS ordered. Role of ultrasound in pregnancy discussed; Anatomy US: ordered. Amniocentesis discussed: not indicated. Follow up in 4 weeks. Discussed clinic routines, schedule of care and testing, genetic screening options, involvement of students and residents under the direct supervision of APPs and  doctors and presence of female providers. Pt verbalized understanding.  Return in about 4 weeks (around 02/19/2023) for Routine prenatal care, Mom+Baby Combined Care.  Future Appointments  Date Time Provider Department Center  02/19/2023  9:15 AM [facility] NURSE [facility]  02/19/2023  9:30 AM [facility] US1 [facility]  02/19/2023  3:15 PM Federico Flake, MD [facility]     Federico Flake, MD 01/22/2023 11:50 AM

## 2023-01-23 ENCOUNTER — Encounter: Payer: Self-pay | Admitting: Family Medicine

## 2023-01-23 ENCOUNTER — Encounter: Payer: Medicaid Other | Admitting: Family Medicine

## 2023-01-23 DIAGNOSIS — Z6791 Unspecified blood type, Rh negative: Secondary | ICD-10-CM | POA: Insufficient documentation

## 2023-01-23 DIAGNOSIS — O99012 Anemia complicating pregnancy, second trimester: Secondary | ICD-10-CM | POA: Insufficient documentation

## 2023-01-23 LAB — CBC/D/PLT+RPR+RH+ABO+RUBIGG...
Antibody Screen: NEGATIVE
Basophils Absolute: 0 10*3/uL (ref 0.0–0.2)
Basos: 0 %
EOS (ABSOLUTE): 0 10*3/uL (ref 0.0–0.4)
Eos: 1 %
HCV Ab: NONREACTIVE
HIV Screen 4th Generation wRfx: NONREACTIVE
Hematocrit: 34 % (ref 34.0–46.6)
Hemoglobin: 10.9 g/dL — ABNORMAL LOW (ref 11.1–15.9)
Hepatitis B Surface Ag: NEGATIVE
Immature Grans (Abs): 0 10*3/uL (ref 0.0–0.1)
Immature Granulocytes: 0 %
Lymphocytes Absolute: 1.3 10*3/uL (ref 0.7–3.1)
Lymphs: 24 %
MCH: 29 pg (ref 26.6–33.0)
MCHC: 32.1 g/dL (ref 31.5–35.7)
MCV: 90 fL (ref 79–97)
Monocytes Absolute: 0.3 10*3/uL (ref 0.1–0.9)
Monocytes: 6 %
Neutrophils Absolute: 3.7 10*3/uL (ref 1.4–7.0)
Neutrophils: 69 %
Platelets: 183 10*3/uL (ref 150–450)
RBC: 3.76 x10E6/uL — ABNORMAL LOW (ref 3.77–5.28)
RDW: 12 % (ref 11.7–15.4)
RPR Ser Ql: NONREACTIVE
Rh Factor: NEGATIVE
Rubella Antibodies, IGG: 5.77 {index} (ref >0.99)
WBC: 5.4 10*3/uL (ref 3.4–10.8)

## 2023-01-23 LAB — HEMOGLOBIN A1C
Est. average glucose Bld gHb Est-mCnc: 105 mg/dL
Hgb A1c MFr Bld: 5.3 % (ref 4.8–5.6)

## 2023-01-23 LAB — HCV INTERPRETATION

## 2023-01-24 LAB — URINE CULTURE, OB REFLEX

## 2023-01-24 LAB — CULTURE, OB URINE

## 2023-01-26 DIAGNOSIS — Z419 Encounter for procedure for purposes other than remedying health state, unspecified: Secondary | ICD-10-CM | POA: Diagnosis not present

## 2023-01-28 ENCOUNTER — Encounter: Payer: Self-pay | Admitting: Family Medicine

## 2023-01-28 ENCOUNTER — Telehealth: Payer: Self-pay | Admitting: Lactation Services

## 2023-01-28 MED ORDER — PRENATAL PLUS 27-1 MG PO TABS
1.0000 | ORAL_TABLET | Freq: Every day | ORAL | 11 refills | Status: AC
Start: 2023-01-28 — End: ?

## 2023-01-28 NOTE — Addendum Note (Signed)
Addended by: Ed Blalock on: 01/28/2023 03:57 PM   Modules accepted: Orders

## 2023-01-28 NOTE — Telephone Encounter (Signed)
Carylon Perches (Key: [id number]) PA Case ID #: [id number] Rx #: [id number] Need Help? Call us at [phone] Outcome Approved today by [facility] Medicaid 2017 Approved. This drug is covered on the Preferred Drug List. It does not require prior approval. Please call the pharmacy to process the claim. Authorization Expiration Date: 05/27/2023 Drug Doxylamine-Pyridoxine 10-10MG  dr tablets Form [employer] of [facility] Electronic Prior Authorization Request Form [phone] NCPDP) Original Claim Info 75 Submit 3 DS For Emerg Fill with PA Type01, PA Number 1111, Level of Service 3FOR EMRG OVD, SCC=13 PER RPH DISCRETION  Called Pharmacy and informed that that Diclegis has been approved. LM to that effect.   Called patient and she reports it was approved. She reports it was previously approved. She requested PNV to be sent to Pharmacy. Was sent in today.

## 2023-01-28 NOTE — Telephone Encounter (Signed)
Margaret Eaton (Key: [id number]) PA Case ID #: [id number] Rx #: [id number] Need Help? Call us at [phone] Status Sent to Plan today Drug Doxylamine-Pyridoxine 10-10MG  dr tablets Form [facility] Medicaid of [facility] Electronic Prior Authorization Request Form [phone] NCPDP) Original Claim Info 75 Submit 3 DS For Emerg Fill with PA Type01, PA Number 1111, Level of Service 3FOR EMRG OVD, SCC=13 PER RPH DISCRETION

## 2023-01-30 LAB — PANORAMA PRENATAL TEST FULL PANEL:PANORAMA TEST PLUS 5 ADDITIONAL MICRODELETIONS: FETAL FRACTION: 19

## 2023-01-30 LAB — CYTOLOGY - PAP
Chlamydia: NEGATIVE
Comment: NEGATIVE
Comment: NEGATIVE
Comment: NEGATIVE
Comment: NORMAL
Diagnosis: NEGATIVE
High risk HPV: NEGATIVE
Neisseria Gonorrhea: NEGATIVE
Trichomonas: NEGATIVE

## 2023-01-31 LAB — HORIZON CUSTOM: REPORT SUMMARY: POSITIVE — AB

## 2023-02-02 ENCOUNTER — Encounter: Payer: Self-pay | Admitting: Family Medicine

## 2023-02-02 DIAGNOSIS — Z148 Genetic carrier of other disease: Secondary | ICD-10-CM | POA: Insufficient documentation

## 2023-02-19 ENCOUNTER — Ambulatory Visit: Payer: Medicaid Other | Attending: Family Medicine

## 2023-02-19 ENCOUNTER — Other Ambulatory Visit: Payer: Self-pay | Admitting: Family Medicine

## 2023-02-19 ENCOUNTER — Ambulatory Visit (INDEPENDENT_AMBULATORY_CARE_PROVIDER_SITE_OTHER): Payer: Medicaid Other | Admitting: Family Medicine

## 2023-02-19 ENCOUNTER — Ambulatory Visit: Payer: Medicaid Other | Admitting: *Deleted

## 2023-02-19 ENCOUNTER — Encounter: Payer: Self-pay | Admitting: *Deleted

## 2023-02-19 VITALS — BP 103/55 | HR 91

## 2023-02-19 VITALS — BP 112/68 | HR 113 | Wt 112.2 lb

## 2023-02-19 DIAGNOSIS — O99012 Anemia complicating pregnancy, second trimester: Secondary | ICD-10-CM | POA: Insufficient documentation

## 2023-02-19 DIAGNOSIS — Z3401 Encounter for supervision of normal first pregnancy, first trimester: Secondary | ICD-10-CM | POA: Insufficient documentation

## 2023-02-19 DIAGNOSIS — O285 Abnormal chromosomal and genetic finding on antenatal screening of mother: Secondary | ICD-10-CM | POA: Diagnosis not present

## 2023-02-19 DIAGNOSIS — O26892 Other specified pregnancy related conditions, second trimester: Secondary | ICD-10-CM | POA: Diagnosis not present

## 2023-02-19 DIAGNOSIS — Z3402 Encounter for supervision of normal first pregnancy, second trimester: Secondary | ICD-10-CM

## 2023-02-19 DIAGNOSIS — Z6791 Unspecified blood type, Rh negative: Secondary | ICD-10-CM | POA: Insufficient documentation

## 2023-02-19 DIAGNOSIS — O26899 Other specified pregnancy related conditions, unspecified trimester: Secondary | ICD-10-CM

## 2023-02-19 DIAGNOSIS — O321XX0 Maternal care for breech presentation, not applicable or unspecified: Secondary | ICD-10-CM | POA: Diagnosis not present

## 2023-02-19 DIAGNOSIS — Z3689 Encounter for other specified antenatal screening: Secondary | ICD-10-CM

## 2023-02-19 DIAGNOSIS — Z363 Encounter for antenatal screening for malformations: Secondary | ICD-10-CM | POA: Diagnosis not present

## 2023-02-19 DIAGNOSIS — Z148 Genetic carrier of other disease: Secondary | ICD-10-CM | POA: Insufficient documentation

## 2023-02-19 DIAGNOSIS — Z3A18 18 weeks gestation of pregnancy: Secondary | ICD-10-CM | POA: Insufficient documentation

## 2023-02-19 DIAGNOSIS — O360120 Maternal care for anti-D [Rh] antibodies, second trimester, not applicable or unspecified: Secondary | ICD-10-CM | POA: Diagnosis not present

## 2023-02-19 NOTE — Progress Notes (Signed)
   PRENATAL VISIT NOTE  Subjective:  Margaret Eaton is a 27 y.o. G1P0000 at [redacted]w[redacted]d being seen today for ongoing prenatal care.  She is currently monitored for the following issues for this low-risk pregnancy and has Supervision of low-risk first pregnancy; Rh negative state in antepartum period; Anemia complicating pregnancy in second trimester; and Carrier of spinal muscular atrophy on their problem list.  Patient reports no complaints.  Contractions: Not present. Vag. Bleeding: None.  Movement: Present. Denies leaking of fluid.   The following portions of the patient's history were reviewed and updated as appropriate: allergies, current medications, past family history, past medical history, past social history, past surgical history and problem list.   Objective:   Vitals:   02/19/23 1556  BP: 112/68  Pulse: (!) 113  Weight: 112 lb 3.2 oz (50.9 kg)    Fetal Status: Fetal Heart Rate (bpm): 154   Movement: Present     General:  Alert, oriented and cooperative. Patient is in no acute distress.  Skin: Skin is warm and dry. No rash noted.   Cardiovascular: Normal heart rate noted  Respiratory: Normal respiratory effort, no problems with respiration noted  Abdomen: Soft, gravid, appropriate for gestational age.  Pain/Pressure: Absent     Pelvic: Cervical exam deferred        Extremities: Normal range of motion.     Mental Status: Normal mood and affect. Normal behavior. Normal judgment and thought content.   Assessment and Plan:  Pregnancy: G1P0000 at [redacted]w[redacted]d 1. Anemia complicating pregnancy in second trimester Lab Results  Component Value Date   HGB 10.9 (L) 01/22/2023   HGB 13.5 05/13/2016  On ferrous sulfate Repeat CBC at 28 weeks, if not improved will need IV fe  2. Carrier of spinal muscular atrophy Partner kit was given   3. Rh negative state in antepartum period Rhogam at 28wk  4. Encounter for supervision of low-risk first pregnancy in second trimester Up to date No  concerns today Question about elderberry syrup-- ok in pregnancy Also asked about nipples changes and exam was done which was WNL Patient had bilateral nipple piercings which are now grown in, reviewed safety of nursing.    Preterm labor symptoms and general obstetric precautions including but not limited to vaginal bleeding, contractions, leaking of fluid and fetal movement were reviewed in detail with the patient. Please refer to After Visit Summary for other counseling recommendations.   Return in about 4 weeks (around 03/19/2023) for Routine prenatal care, Mom+Baby Combined Care.  Future Appointments  Date Time Provider Department Center  03/17/2023  3:35 PM Federico Flake, MD [facility]    Federico Flake, MD

## 2023-02-19 NOTE — Patient Instructions (Signed)

## 2023-02-25 DIAGNOSIS — Z419 Encounter for procedure for purposes other than remedying health state, unspecified: Secondary | ICD-10-CM | POA: Diagnosis not present

## 2023-03-17 ENCOUNTER — Other Ambulatory Visit: Payer: Self-pay

## 2023-03-17 ENCOUNTER — Ambulatory Visit (INDEPENDENT_AMBULATORY_CARE_PROVIDER_SITE_OTHER): Payer: Medicaid Other | Admitting: Family Medicine

## 2023-03-17 ENCOUNTER — Encounter: Payer: Self-pay | Admitting: Family Medicine

## 2023-03-17 VITALS — BP 106/68 | HR 99 | Wt 116.8 lb

## 2023-03-17 DIAGNOSIS — Z3A22 22 weeks gestation of pregnancy: Secondary | ICD-10-CM

## 2023-03-17 DIAGNOSIS — Z3402 Encounter for supervision of normal first pregnancy, second trimester: Secondary | ICD-10-CM

## 2023-03-17 DIAGNOSIS — Z6791 Unspecified blood type, Rh negative: Secondary | ICD-10-CM

## 2023-03-17 DIAGNOSIS — O99012 Anemia complicating pregnancy, second trimester: Secondary | ICD-10-CM | POA: Diagnosis not present

## 2023-03-17 DIAGNOSIS — O26892 Other specified pregnancy related conditions, second trimester: Secondary | ICD-10-CM

## 2023-03-17 DIAGNOSIS — O26899 Other specified pregnancy related conditions, unspecified trimester: Secondary | ICD-10-CM

## 2023-03-17 LAB — POCT HEMOGLOBIN-HEMACUE: Hemoglobin: 8.7 g/dL — ABNORMAL LOW (ref 12.0–15.0)

## 2023-03-17 NOTE — Progress Notes (Signed)
   PRENATAL VISIT NOTE  Subjective:  Margaret Eaton is a 27 y.o. G1P0000 at [redacted]w[redacted]d being seen today for ongoing prenatal care.  She is currently monitored for the following issues for this low-risk pregnancy and has Supervision of low-risk first pregnancy; Rh negative state in antepartum period; Anemia complicating pregnancy in second trimester; and Carrier of spinal muscular atrophy on their problem list.  Patient reports no complaints.  Contractions: Not present. Vag. Bleeding: None.  Movement: Present. Denies leaking of fluid.   The following portions of the patient's history were reviewed and updated as appropriate: allergies, current medications, past family history, past medical history, past social history, past surgical history and problem list.   Objective:   Vitals:   03/17/23 1534  BP: 106/68  Pulse: 99  Weight: 116 lb 12.8 oz (53 kg)    Fetal Status: Fetal Heart Rate (bpm): 150   Movement: Present     General:  Alert, oriented and cooperative. Patient is in no acute distress.  Skin: Skin is warm and dry. No rash noted.   Cardiovascular: Normal heart rate noted  Respiratory: Normal respiratory effort, no problems with respiration noted  Abdomen: Soft, gravid, appropriate for gestational age.  Pain/Pressure: Absent     Pelvic: Cervical exam deferred        Extremities: Normal range of motion.     Mental Status: Normal mood and affect. Normal behavior. Normal judgment and thought content.   Assessment and Plan:  Pregnancy: G1P0000 at [redacted]w[redacted]d  1. Anemia complicating pregnancy in second trimester Last HGB was 10.9  Tolerating that PO Fe well POC today was 8.7 which is surprising. Patient called and is amenable to IV Fe infusion. Ordered Venofer 500mg  Weekly x2 Repeat CBC at 28 weeks   2. Encounter for supervision of low-risk first pregnancy in second trimester Up to date Feeling flutters FH appropriate  3. Rh negative state in antepartum period Rhogam at 28  wks  Preterm labor symptoms and general obstetric precautions including but not limited to vaginal bleeding, contractions, leaking of fluid and fetal movement were reviewed in detail with the patient. Please refer to After Visit Summary for other counseling recommendations.   No follow-ups on file.  Future Appointments  Date Time Provider Department Center  04/14/2023  3:55 PM Nobie Putnam, Cyndi Lennert, MD [facility]    Federico Flake, MD

## 2023-03-18 ENCOUNTER — Telehealth: Payer: Self-pay

## 2023-03-18 ENCOUNTER — Encounter: Payer: Self-pay | Admitting: Family Medicine

## 2023-03-18 NOTE — Telephone Encounter (Signed)
Dr. Alvester Morin, patient will be scheduled as soon as possible  Auth Submission: NO AUTH NEEDED Site of care: Site of care: [facility] Payer: [facility] Medicaid Medication & CPT/J Code(s) submitted: Venofer (Iron Sucrose) J1756 Route of submission (phone, fax, portal):  Phone # Fax # Auth type: Buy/Bill PB Units/visits requested: 500mg  x 2 doses Reference number:  Approval from: 03/18/23 to 05/27/23

## 2023-03-28 DIAGNOSIS — Z419 Encounter for procedure for purposes other than remedying health state, unspecified: Secondary | ICD-10-CM | POA: Diagnosis not present

## 2023-04-09 ENCOUNTER — Ambulatory Visit: Payer: Medicaid Other

## 2023-04-09 VITALS — BP 99/61 | HR 81 | Temp 99.3°F | Resp 18 | Ht 63.0 in | Wt 121.6 lb

## 2023-04-09 DIAGNOSIS — O99012 Anemia complicating pregnancy, second trimester: Secondary | ICD-10-CM | POA: Diagnosis not present

## 2023-04-09 DIAGNOSIS — Z3A25 25 weeks gestation of pregnancy: Secondary | ICD-10-CM

## 2023-04-09 DIAGNOSIS — D508 Other iron deficiency anemias: Secondary | ICD-10-CM | POA: Diagnosis not present

## 2023-04-09 MED ORDER — DIPHENHYDRAMINE HCL 25 MG PO CAPS
25.0000 mg | ORAL_CAPSULE | Freq: Once | ORAL | Status: AC
  Administered 2023-04-09: 25 mg via ORAL
  Filled 2023-04-09: qty 1

## 2023-04-09 MED ORDER — IRON SUCROSE 500 MG IVPB - SIMPLE MED
500.0000 mg | INTRAVENOUS | Status: DC
Start: 2023-04-09 — End: 2023-04-09
  Administered 2023-04-09: 500 mg via INTRAVENOUS
  Filled 2023-04-09: qty 275

## 2023-04-09 MED ORDER — ACETAMINOPHEN 325 MG PO TABS
650.0000 mg | ORAL_TABLET | Freq: Once | ORAL | Status: AC
  Administered 2023-04-09: 650 mg via ORAL
  Filled 2023-04-09: qty 2

## 2023-04-09 NOTE — Progress Notes (Signed)
Diagnosis: Iron Deficiency Anemia  Provider:  Chilton Greathouse MD  Procedure: IV Infusion  IV Type: Peripheral, IV Location: R Forearm  Venofer (Iron Sucrose), Dose: 500 mg  Infusion Start Time: 0959  Infusion Stop Time: 1423  Post Infusion IV Care: Observation period completed and Peripheral IV Discontinued  Discharge: Condition: Good, Destination: Home . AVS Declined  Performed by:  Garnette Czech, RN

## 2023-04-14 ENCOUNTER — Ambulatory Visit (INDEPENDENT_AMBULATORY_CARE_PROVIDER_SITE_OTHER): Payer: Medicaid Other | Admitting: Family Medicine

## 2023-04-14 ENCOUNTER — Other Ambulatory Visit: Payer: Self-pay

## 2023-04-14 VITALS — BP 104/70 | HR 112 | Wt 122.2 lb

## 2023-04-14 DIAGNOSIS — Z3A26 26 weeks gestation of pregnancy: Secondary | ICD-10-CM

## 2023-04-14 DIAGNOSIS — Z148 Genetic carrier of other disease: Secondary | ICD-10-CM

## 2023-04-14 DIAGNOSIS — O99012 Anemia complicating pregnancy, second trimester: Secondary | ICD-10-CM | POA: Diagnosis not present

## 2023-04-14 DIAGNOSIS — Z3402 Encounter for supervision of normal first pregnancy, second trimester: Secondary | ICD-10-CM

## 2023-04-14 DIAGNOSIS — Z6791 Unspecified blood type, Rh negative: Secondary | ICD-10-CM

## 2023-04-14 DIAGNOSIS — O26899 Other specified pregnancy related conditions, unspecified trimester: Secondary | ICD-10-CM

## 2023-04-14 DIAGNOSIS — O26892 Other specified pregnancy related conditions, second trimester: Secondary | ICD-10-CM

## 2023-04-14 NOTE — Progress Notes (Signed)
   PRENATAL VISIT NOTE  Subjective:  Margaret Eaton is a 27 y.o. G1P0000 at [redacted]w[redacted]d being seen today for ongoing prenatal care.  She is currently monitored for the following issues for this low-risk pregnancy and has Supervision of low-risk first pregnancy; Rh negative state in antepartum period; Anemia complicating pregnancy in second trimester; and Carrier of spinal muscular atrophy on their problem list.  Patient reports no contractions, no cramping, and no leaking.   .  .   . Denies leaking of fluid.   The following portions of the patient's history were reviewed and updated as appropriate: allergies, current medications, past family history, past medical history, past social history, past surgical history and problem list.   Objective:  There were no vitals filed for this visit.  Fetal Status:           General:  Alert, oriented and cooperative. Patient is in no acute distress.  Skin: Skin is warm and dry. No rash noted.   Cardiovascular: Normal heart rate noted  Respiratory: Normal respiratory effort, no problems with respiration noted  Abdomen: Soft, gravid, appropriate for gestational age.        Pelvic: Cervical exam deferred        Extremities: Normal range of motion.     Mental Status: Normal mood and affect. Normal behavior. Normal judgment and thought content.   Assessment and Plan:  Pregnancy: G1P0000 at [redacted]w[redacted]d 1. Encounter for supervision of low-risk first pregnancy in second trimester FHR and BP appropriate today Continue routine prenatal care  2. Anemia complicating pregnancy in second trimester Patient scheduled for iron infusions Repeat CBC at 28 weeks  3. Rh negative state in antepartum period Patient will need RhoGAM at 28 weeks  4. Carrier of spinal muscular atrophy Partner kit given  5. [redacted] weeks gestation of pregnancy Follow-up in 2 weeks  Preterm labor symptoms and general obstetric precautions including but not limited to vaginal bleeding,  contractions, leaking of fluid and fetal movement were reviewed in detail with the patient. Please refer to After Visit Summary for other counseling recommendations.   No follow-ups on file.  Future Appointments  Date Time Provider Department Center  04/14/2023  3:55 PM Celedonio Savage, MD [facility]  04/23/2023  9:00 AM [facility] 5 [facility] None  04/28/2023  2:55 PM Celedonio Savage, MD [facility]  05/12/2023 10:55 AM Celedonio Savage, MD [facility]  05/26/2023  2:35 PM Federico Flake, MD [facility]    Celedonio Savage, MD

## 2023-04-18 ENCOUNTER — Other Ambulatory Visit: Payer: Self-pay

## 2023-04-18 DIAGNOSIS — Z3A26 26 weeks gestation of pregnancy: Secondary | ICD-10-CM

## 2023-04-23 ENCOUNTER — Ambulatory Visit: Payer: Medicaid Other

## 2023-04-23 VITALS — BP 110/74 | HR 94 | Temp 99.4°F | Resp 18 | Ht 63.0 in | Wt 123.8 lb

## 2023-04-23 DIAGNOSIS — O99012 Anemia complicating pregnancy, second trimester: Secondary | ICD-10-CM | POA: Diagnosis not present

## 2023-04-23 DIAGNOSIS — D508 Other iron deficiency anemias: Secondary | ICD-10-CM | POA: Diagnosis not present

## 2023-04-23 DIAGNOSIS — Z3A27 27 weeks gestation of pregnancy: Secondary | ICD-10-CM

## 2023-04-23 MED ORDER — DIPHENHYDRAMINE HCL 25 MG PO CAPS
25.0000 mg | ORAL_CAPSULE | Freq: Once | ORAL | Status: AC
  Administered 2023-04-23: 25 mg via ORAL
  Filled 2023-04-23: qty 1

## 2023-04-23 MED ORDER — IRON SUCROSE 500 MG IVPB - SIMPLE MED
500.0000 mg | INTRAVENOUS | Status: DC
  Administered 2023-04-23: 500 mg via INTRAVENOUS

## 2023-04-23 MED ORDER — ACETAMINOPHEN 325 MG PO TABS
650.0000 mg | ORAL_TABLET | Freq: Once | ORAL | Status: AC
  Administered 2023-04-23: 650 mg via ORAL
  Filled 2023-04-23: qty 2

## 2023-04-23 NOTE — Progress Notes (Signed)
Diagnosis: Iron Deficiency Anemia  Provider:  Chilton Greathouse MD  Procedure: IV Infusion  IV Type: Peripheral, IV Location: L Forearm  Venofer (Iron Sucrose), Dose: 500 mg  Infusion Start Time: 1191  Infusion Stop Time: 1330  Post Infusion IV Care: Observation period completed and Peripheral IV Discontinued  Discharge: Condition: Good, Destination: Home . AVS Declined  Performed by:  Rico Ala, LPN

## 2023-04-27 DIAGNOSIS — Z419 Encounter for procedure for purposes other than remedying health state, unspecified: Secondary | ICD-10-CM | POA: Diagnosis not present

## 2023-04-28 ENCOUNTER — Ambulatory Visit: Payer: Medicaid Other | Admitting: Family Medicine

## 2023-04-28 ENCOUNTER — Other Ambulatory Visit: Payer: Self-pay

## 2023-04-28 ENCOUNTER — Encounter: Payer: Medicaid Other | Admitting: Family Medicine

## 2023-04-28 ENCOUNTER — Other Ambulatory Visit: Payer: Medicaid Other

## 2023-04-28 VITALS — BP 103/72 | HR 116 | Wt 126.4 lb

## 2023-04-28 DIAGNOSIS — O26892 Other specified pregnancy related conditions, second trimester: Secondary | ICD-10-CM

## 2023-04-28 DIAGNOSIS — Z3A28 28 weeks gestation of pregnancy: Secondary | ICD-10-CM

## 2023-04-28 DIAGNOSIS — O99012 Anemia complicating pregnancy, second trimester: Secondary | ICD-10-CM

## 2023-04-28 DIAGNOSIS — D649 Anemia, unspecified: Secondary | ICD-10-CM | POA: Diagnosis not present

## 2023-04-28 DIAGNOSIS — Z148 Genetic carrier of other disease: Secondary | ICD-10-CM | POA: Diagnosis not present

## 2023-04-28 DIAGNOSIS — Z6791 Unspecified blood type, Rh negative: Secondary | ICD-10-CM | POA: Diagnosis not present

## 2023-04-28 DIAGNOSIS — Z3A26 26 weeks gestation of pregnancy: Secondary | ICD-10-CM

## 2023-04-28 DIAGNOSIS — Z3402 Encounter for supervision of normal first pregnancy, second trimester: Secondary | ICD-10-CM

## 2023-04-28 DIAGNOSIS — Z3492 Encounter for supervision of normal pregnancy, unspecified, second trimester: Secondary | ICD-10-CM | POA: Diagnosis not present

## 2023-04-28 MED ORDER — RHO D IMMUNE GLOBULIN 1500 UNIT/2ML IJ SOSY
300.0000 ug | PREFILLED_SYRINGE | Freq: Once | INTRAMUSCULAR | Status: AC
  Administered 2023-04-28: 300 ug via INTRAMUSCULAR

## 2023-04-28 NOTE — Progress Notes (Signed)
   PRENATAL VISIT NOTE  Subjective:  Margaret Eaton is a 27 y.o. G1P0000 at [redacted]w[redacted]d being seen today for ongoing prenatal care.  She is currently monitored for the following issues for this low-risk pregnancy and has Supervision of low-risk first pregnancy; Rh negative state in antepartum period; Anemia complicating pregnancy in second trimester; and Carrier of spinal muscular atrophy on their problem list.  Patient reports no bleeding, no contractions, no cramping, and no leaking.  Contractions: Not present. Vag. Bleeding: None.  Movement: Present. Denies leaking of fluid.   The following portions of the patient's history were reviewed and updated as appropriate: allergies, current medications, past family history, past medical history, past social history, past surgical history and problem list.   Objective:   Vitals:   04/28/23 0835  BP: 103/72  Pulse: (!) 116  Weight: 126 lb 6 oz (57.3 kg)    Fetal Status: Fetal Heart Rate (bpm): 140   Movement: Present     General:  Alert, oriented and cooperative. Patient is in no acute distress.  Skin: Skin is warm and dry. No rash noted.   Cardiovascular: Normal heart rate noted  Respiratory: Normal respiratory effort, no problems with respiration noted  Abdomen: Soft, gravid, appropriate for gestational age.  Pain/Pressure: Absent     Pelvic: Cervical exam deferred        Extremities: Normal range of motion.  Edema: None  Mental Status: Normal mood and affect. Normal behavior. Normal judgment and thought content.   Assessment and Plan:  Pregnancy: G1P0000 at [redacted]w[redacted]d 1. Encounter for supervision of low-risk first pregnancy in second trimester FHR and BP appropriate today Continue routine prenatal care  2. Anemia complicating pregnancy in second trimester S/p iron infusion x 2 CBC collected today  3. Rh negative state in antepartum period RhoGAM given today  4. Carrier of spinal muscular atrophy Partner has testing kit but has not  submitted it.  5. [redacted] weeks gestation of pregnancy Follow-up in 2 weeks GTT collected today   Preterm labor symptoms and general obstetric precautions including but not limited to vaginal bleeding, contractions, leaking of fluid and fetal movement were reviewed in detail with the patient. Please refer to After Visit Summary for other counseling recommendations.   No follow-ups on file.  Future Appointments  Date Time Provider Department Center  05/12/2023 10:55 AM Celedonio Savage, MD [facility]  05/26/2023  2:35 PM Federico Flake, MD [facility]    Celedonio Savage, MD\

## 2023-04-29 LAB — CBC
Hematocrit: 31.6 % — ABNORMAL LOW (ref 34.0–46.6)
Hemoglobin: 10.1 g/dL — ABNORMAL LOW (ref 11.1–15.9)
MCH: 30.3 pg (ref 26.6–33.0)
MCHC: 32 g/dL (ref 31.5–35.7)
MCV: 95 fL (ref 79–97)
Platelets: 175 10*3/uL (ref 150–450)
RBC: 3.33 x10E6/uL — ABNORMAL LOW (ref 3.77–5.28)
RDW: 12.5 % (ref 11.7–15.4)
WBC: 6.5 10*3/uL (ref 3.4–10.8)

## 2023-04-29 LAB — RPR: RPR Ser Ql: NONREACTIVE

## 2023-04-29 LAB — GLUCOSE TOLERANCE, 2 HOURS W/ 1HR
Glucose, 1 hour: 88 mg/dL (ref 70–179)
Glucose, 2 hour: 87 mg/dL (ref 70–152)
Glucose, Fasting: 73 mg/dL (ref 70–91)

## 2023-04-29 LAB — HIV ANTIBODY (ROUTINE TESTING W REFLEX): HIV Screen 4th Generation wRfx: NONREACTIVE

## 2023-04-30 ENCOUNTER — Encounter: Payer: Self-pay | Admitting: Family Medicine

## 2023-05-12 ENCOUNTER — Encounter: Payer: Medicaid Other | Admitting: Family Medicine

## 2023-05-26 ENCOUNTER — Ambulatory Visit: Payer: Medicaid Other | Admitting: Family Medicine

## 2023-05-26 ENCOUNTER — Other Ambulatory Visit: Payer: Self-pay

## 2023-05-26 VITALS — BP 113/80 | HR 73 | Wt 125.2 lb

## 2023-05-26 DIAGNOSIS — Z6791 Unspecified blood type, Rh negative: Secondary | ICD-10-CM

## 2023-05-26 DIAGNOSIS — D649 Anemia, unspecified: Secondary | ICD-10-CM

## 2023-05-26 DIAGNOSIS — O99012 Anemia complicating pregnancy, second trimester: Secondary | ICD-10-CM

## 2023-05-26 DIAGNOSIS — Z3A32 32 weeks gestation of pregnancy: Secondary | ICD-10-CM

## 2023-05-26 DIAGNOSIS — Z3403 Encounter for supervision of normal first pregnancy, third trimester: Secondary | ICD-10-CM

## 2023-05-26 DIAGNOSIS — O360130 Maternal care for anti-D [Rh] antibodies, third trimester, not applicable or unspecified: Secondary | ICD-10-CM

## 2023-05-26 NOTE — Progress Notes (Signed)
   PRENATAL VISIT NOTE  Subjective:  Margaret Eaton is a 27 y.o. G1P0000 at [redacted]w[redacted]d being seen today for ongoing prenatal care.  She is currently monitored for the following issues for this low-risk pregnancy and has Supervision of low-risk first pregnancy; Rh negative state in antepartum period; Anemia complicating pregnancy in second trimester; and Carrier of spinal muscular atrophy on their problem list.  Patient reports no complaints.  Contractions: Not present. Vag. Bleeding: None.  Movement: Present. Denies leaking of fluid.   The following portions of the patient's history were reviewed and updated as appropriate: allergies, current medications, past family history, past medical history, past social history, past surgical history and problem list.   Objective:   Vitals:   05/26/23 1447  BP: 113/80  Pulse: 73  Weight: 125 lb 3 oz (56.8 kg)    Fetal Status: Fetal Heart Rate (bpm): 138   Movement: Present     General:  Alert, oriented and cooperative. Patient is in no acute distress.  Skin: Skin is warm and dry. No rash noted.   Cardiovascular: Normal heart rate noted  Respiratory: Normal respiratory effort, no problems with respiration noted  Abdomen: Soft, gravid, appropriate for gestational age.  Pain/Pressure: Present     Pelvic: Cervical exam deferred        Extremities: Normal range of motion.  Edema: None  Mental Status: Normal mood and affect. Normal behavior. Normal judgment and thought content.   Assessment and Plan:  Pregnancy: G1P0000 at [redacted]w[redacted]d 1. Anemia complicating pregnancy in second trimester Lab Results  Component Value Date   HGB 10.1 (L) 04/28/2023   HGB 8.7 (L) 03/17/2023  Improved at last check S/p IV Fe infusions Continue iron therapy  2. Encounter for supervision of low-risk first pregnancy in third trimester (Primary) Vigorous movement FH appropriate Asking about 3D ultrasound.   3. Rh negative state in antepartum period Received Rhogam  04/28/23  4. [redacted] weeks gestation of pregnancy   Preterm labor symptoms and general obstetric precautions including but not limited to vaginal bleeding, contractions, leaking of fluid and fetal movement were reviewed in detail with the patient. Please refer to After Visit Summary for other counseling recommendations.   No follow-ups on file.  Future Appointments  Date Time Provider Department Center  06/09/2023  2:35 PM Federico Flake, MD [facility]  06/23/2023  2:55 PM Nobie Putnam, Cyndi Lennert, MD [facility]    Federico Flake, MD

## 2023-05-26 NOTE — Patient Instructions (Signed)
[email or web address]   This is the link to sign up for all Childbirth education classes. I highly recommend the Coping Strategies class.      Considering Waterbirth? Guide for patients at Center for [facility] [facility]) Why consider waterbirth? Gentle birth for babies  Less pain medicine used in labor  May allow for passive descent/less pushing  May reduce perineal tears  More mobility and instinctive maternal position changes  Increased maternal relaxation   Is waterbirth safe? What are the risks of infection, drowning or other complications? Infection:  Very low risk (3.7 % for tub vs 4.8% for bed)  7 in 8000 waterbirths with documented infection  Poorly cleaned equipment most common cause  Slightly lower group B strep transmission rate  Drowning  Maternal:  Very low risk  Related to seizures or fainting  Newborn:  Very low risk. No evidence of increased risk of respiratory problems in multiple large studies  Physiological protection from breathing under water  Avoid underwater birth if there are any fetal complications  Once baby's head is out of the water, keep it out.  Birth complication  Some reports of cord trauma, but risk decreased by bringing baby to surface gradually  No evidence of increased risk of shoulder dystocia. Mothers can usually change positions faster in water than in a bed, possibly aiding the maneuvers to free the shoulder.   There are 2 things you MUST do to have a waterbirth with [facility]: Attend a waterbirth class at [facility]   3rd Wednesday of every month from 7-9 pm (virtual during COVID) [employer] at [email or web address] or [email or web address] or by calling [phone] Bring [place] the certificate from the class to your prenatal appointment or send via MyChart Meet with a midwife at 36 weeks* to see if you can still plan a waterbirth and to sign the consent.   *We also recommend that you  schedule as many of your prenatal visits with a midwife as possible.    Helpful information: You may want to bring a bathing suit top to the hospital to wear during labor but this is optional.  All other supplies are provided by the hospital. Please arrive at the hospital with signs of active labor, and do not wait at home until late in labor. It takes 45 min- 1 hour for fetal monitoring, and check in to your room to take place, plus transport and filling of the waterbirth tub.    Things that would prevent you from having a waterbirth: Premature, <37wks  Previous cesarean birth  Presence of thick meconium-stained fluid  Multiple gestation (Twins, triplets, etc.)  Uncontrolled diabetes or gestational diabetes requiring medication  Hypertension diagnosed in pregnancy or preexisting hypertension (gestational hypertension, preeclampsia, or chronic hypertension) Fetal growth restriction (your baby measures less than 10th percentile on ultrasound) Heavy vaginal bleeding  Non-reassuring fetal heart rate  Active infection (MRSA, etc.). Group B Strep is NOT a contraindication for waterbirth.  If your labor has to be induced and induction method requires continuous monitoring of the baby's heart rate  Other risks/issues identified by your obstetrical provider   Please remember that birth is unpredictable. Under certain unforeseeable circumstances your provider may advise against giving birth in the tub. These decisions will be made on a case-by-case basis and with the safety of you and your baby as our highest priority.    Updated 08/29/21

## 2023-05-28 DIAGNOSIS — Z419 Encounter for procedure for purposes other than remedying health state, unspecified: Secondary | ICD-10-CM | POA: Diagnosis not present

## 2023-06-04 ENCOUNTER — Encounter: Payer: Self-pay | Admitting: Family Medicine

## 2023-06-09 ENCOUNTER — Encounter: Payer: Medicaid Other | Admitting: Family Medicine

## 2023-06-23 ENCOUNTER — Ambulatory Visit (INDEPENDENT_AMBULATORY_CARE_PROVIDER_SITE_OTHER): Payer: Medicaid Other | Admitting: Family Medicine

## 2023-06-23 ENCOUNTER — Other Ambulatory Visit (HOSPITAL_COMMUNITY)
Admission: RE | Admit: 2023-06-23 | Discharge: 2023-06-23 | Disposition: A | Discharge status: Home / Self Care | Payer: Medicaid Other | Source: Ambulatory Visit | Attending: Family Medicine | Admitting: Family Medicine

## 2023-06-23 ENCOUNTER — Other Ambulatory Visit: Payer: Self-pay

## 2023-06-23 VITALS — BP 109/76 | HR 101 | Wt 129.0 lb

## 2023-06-23 DIAGNOSIS — Z3A36 36 weeks gestation of pregnancy: Secondary | ICD-10-CM

## 2023-06-23 DIAGNOSIS — O26899 Other specified pregnancy related conditions, unspecified trimester: Secondary | ICD-10-CM

## 2023-06-23 DIAGNOSIS — O26893 Other specified pregnancy related conditions, third trimester: Secondary | ICD-10-CM

## 2023-06-23 DIAGNOSIS — Z3493 Encounter for supervision of normal pregnancy, unspecified, third trimester: Secondary | ICD-10-CM | POA: Insufficient documentation

## 2023-06-23 DIAGNOSIS — D649 Anemia, unspecified: Secondary | ICD-10-CM

## 2023-06-23 DIAGNOSIS — O99013 Anemia complicating pregnancy, third trimester: Secondary | ICD-10-CM

## 2023-06-23 DIAGNOSIS — Z6791 Unspecified blood type, Rh negative: Secondary | ICD-10-CM

## 2023-06-23 DIAGNOSIS — O99012 Anemia complicating pregnancy, second trimester: Secondary | ICD-10-CM

## 2023-06-23 DIAGNOSIS — Z3403 Encounter for supervision of normal first pregnancy, third trimester: Secondary | ICD-10-CM

## 2023-06-23 NOTE — Progress Notes (Signed)
   PRENATAL VISIT NOTE  Subjective:  Margaret Eaton is a 28 y.o. G1P0000 at [redacted]w[redacted]d being seen today for ongoing prenatal care.  She is currently monitored for the following issues for this low-risk pregnancy and has Supervision of low-risk first pregnancy; Rh negative state in antepartum period; Anemia complicating pregnancy in second trimester; and Carrier of spinal muscular atrophy on their problem list.  Patient reports no bleeding, no contractions, no cramping, and no leaking.  Contractions: Not present. Vag. Bleeding: None.  Movement: Present. Denies leaking of fluid.   The following portions of the patient's history were reviewed and updated as appropriate: allergies, current medications, past family history, past medical history, past social history, past surgical history and problem list.   Objective:   Vitals:   06/23/23 1508  BP: 109/76  Pulse: (!) 101  Weight: 129 lb (58.5 kg)    Fetal Status: Fetal Heart Rate (bpm): 145   Movement: Present     General:  Alert, oriented and cooperative. Patient is in no acute distress.  Skin: Skin is warm and dry. No rash noted.   Cardiovascular: Normal heart rate noted  Respiratory: Normal respiratory effort, no problems with respiration noted  Abdomen: Soft, gravid, appropriate for gestational age.  Pain/Pressure: Absent     Pelvic: Cervical exam deferred        Extremities: Normal range of motion.  Edema: None  Mental Status: Normal mood and affect. Normal behavior. Normal judgment and thought content.   Assessment and Plan:  Pregnancy: G1P0000 at [redacted]w[redacted]d 1. [redacted] weeks gestation of pregnancy (Primary) FHR and BP appropriate today - GC/Chlamydia probe amp ([place])not at [facility] - Strep Gp B Culture+Rflx  2. Encounter for supervision of low-risk first pregnancy in third trimester  3. Rh negative state in antepartum period Postpartum RhoGAM eval  4. Anemia complicating pregnancy in second trimester Continuing iron  Preterm labor  symptoms and general obstetric precautions including but not limited to vaginal bleeding, contractions, leaking of fluid and fetal movement were reviewed in detail with the patient. Please refer to After Visit Summary for other counseling recommendations.   No follow-ups on file.  Future Appointments  Date Time Provider Department Center  06/30/2023  2:55 PM Celedonio Savage, MD [facility]  07/09/2023  2:35 PM Federico Flake, MD [facility]  07/16/2023  2:35 PM Federico Flake, MD [facility]  07/23/2023  3:15 PM [facility] US2 [facility]  07/23/2023  3:55 PM Marialuiza Car, Cyndi Lennert, MD [facility]    Celedonio Savage, MD

## 2023-06-25 LAB — GC/CHLAMYDIA PROBE AMP (~~LOC~~) NOT AT ARMC
Chlamydia: NEGATIVE
Comment: NEGATIVE
Comment: NORMAL
Neisseria Gonorrhea: NEGATIVE

## 2023-06-26 LAB — STREP GP B CULTURE+RFLX: Strep Gp B Culture+Rflx: NEGATIVE

## 2023-06-28 DIAGNOSIS — Z419 Encounter for procedure for purposes other than remedying health state, unspecified: Secondary | ICD-10-CM | POA: Diagnosis not present

## 2023-06-30 ENCOUNTER — Other Ambulatory Visit: Payer: Self-pay

## 2023-06-30 ENCOUNTER — Ambulatory Visit: Payer: Medicaid Other | Admitting: Family Medicine

## 2023-06-30 VITALS — BP 109/78 | HR 103 | Wt 132.0 lb

## 2023-06-30 DIAGNOSIS — O26893 Other specified pregnancy related conditions, third trimester: Secondary | ICD-10-CM

## 2023-06-30 DIAGNOSIS — Z3403 Encounter for supervision of normal first pregnancy, third trimester: Secondary | ICD-10-CM

## 2023-06-30 DIAGNOSIS — O99013 Anemia complicating pregnancy, third trimester: Secondary | ICD-10-CM

## 2023-06-30 DIAGNOSIS — O99012 Anemia complicating pregnancy, second trimester: Secondary | ICD-10-CM

## 2023-06-30 DIAGNOSIS — Z3A37 37 weeks gestation of pregnancy: Secondary | ICD-10-CM

## 2023-06-30 DIAGNOSIS — Z6791 Unspecified blood type, Rh negative: Secondary | ICD-10-CM

## 2023-06-30 DIAGNOSIS — O26899 Other specified pregnancy related conditions, unspecified trimester: Secondary | ICD-10-CM

## 2023-06-30 NOTE — Progress Notes (Signed)
   PRENATAL VISIT NOTE  Subjective:  Margaret Eaton is a 28 y.o. G1P0000 at [redacted]w[redacted]d being seen today for ongoing prenatal care.  She is currently monitored for the following issues for this low-risk pregnancy and has Supervision of low-risk first pregnancy; Rh negative state in antepartum period; Anemia complicating pregnancy in second trimester; and Carrier of spinal muscular atrophy on their problem list.  Patient reports no bleeding, no contractions, no cramping, and no leaking.  Contractions: Not present. Vag. Bleeding: None.  Movement: Present. Denies leaking of fluid.   The following portions of the patient's history were reviewed and updated as appropriate: allergies, current medications, past family history, past medical history, past social history, past surgical history and problem list.   Objective:   Vitals:   06/30/23 1512  BP: 109/78  Pulse: (!) 103  Weight: 132 lb (59.9 kg)    Fetal Status: Fetal Heart Rate (bpm): 140   Movement: Present     General:  Alert, oriented and cooperative. Patient is in no acute distress.  Skin: Skin is warm and dry. No rash noted.   Cardiovascular: Normal heart rate noted  Respiratory: Normal respiratory effort, no problems with respiration noted  Abdomen: Soft, gravid, appropriate for gestational age.  Pain/Pressure: Present     Pelvic: Cervical exam deferred        Extremities: Normal range of motion.  Edema: None  Mental Status: Normal mood and affect. Normal behavior. Normal judgment and thought content.   Assessment and Plan:  Pregnancy: G1P0000 at [redacted]w[redacted]d 1. Encounter for supervision of low-risk first pregnancy in third trimester (Primary) Cephalic by Leopold's at today's visit GBS negative from previous visit Discussed strict MAU precautions  2. Rh negative state in antepartum period Will need RhoGAM workup postpartum  3. Anemia complicating pregnancy in third trimester On iron  4. [redacted] weeks gestation of pregnancy   Term  labor symptoms and general obstetric precautions including but not limited to vaginal bleeding, contractions, leaking of fluid and fetal movement were reviewed in detail with the patient. Please refer to After Visit Summary for other counseling recommendations.   No follow-ups on file.  Future Appointments  Date Time Provider Department Center  07/09/2023  2:35 PM Federico Flake, MD [facility]  07/16/2023  2:35 PM Federico Flake, MD [facility]  07/23/2023  3:15 PM [facility] US2 [facility]  07/23/2023  3:55 PM Panda Crossin, Cyndi Lennert, MD [facility]    Celedonio Savage, MD

## 2023-07-01 DIAGNOSIS — Z3483 Encounter for supervision of other normal pregnancy, third trimester: Secondary | ICD-10-CM | POA: Diagnosis not present

## 2023-07-01 DIAGNOSIS — Z3482 Encounter for supervision of other normal pregnancy, second trimester: Secondary | ICD-10-CM | POA: Diagnosis not present

## 2023-07-09 ENCOUNTER — Other Ambulatory Visit: Payer: Self-pay

## 2023-07-09 ENCOUNTER — Ambulatory Visit (INDEPENDENT_AMBULATORY_CARE_PROVIDER_SITE_OTHER): Payer: Medicaid Other | Admitting: Family Medicine

## 2023-07-09 VITALS — BP 110/73 | HR 83 | Wt 131.0 lb

## 2023-07-09 DIAGNOSIS — Z3403 Encounter for supervision of normal first pregnancy, third trimester: Secondary | ICD-10-CM

## 2023-07-09 DIAGNOSIS — Z6791 Unspecified blood type, Rh negative: Secondary | ICD-10-CM

## 2023-07-09 DIAGNOSIS — O99012 Anemia complicating pregnancy, second trimester: Secondary | ICD-10-CM

## 2023-07-09 DIAGNOSIS — O99013 Anemia complicating pregnancy, third trimester: Secondary | ICD-10-CM | POA: Diagnosis not present

## 2023-07-09 DIAGNOSIS — O26899 Other specified pregnancy related conditions, unspecified trimester: Secondary | ICD-10-CM

## 2023-07-09 DIAGNOSIS — Z3A38 38 weeks gestation of pregnancy: Secondary | ICD-10-CM

## 2023-07-09 DIAGNOSIS — O26893 Other specified pregnancy related conditions, third trimester: Secondary | ICD-10-CM

## 2023-07-09 LAB — POCT HEMOGLOBIN-HEMACUE: Hemoglobin: 10.8 g/dL — ABNORMAL LOW (ref 12.0–15.0)

## 2023-07-09 NOTE — Progress Notes (Unsigned)
   PRENATAL VISIT NOTE  Subjective:  Margaret Eaton is a 28 y.o. G1P0000 at [redacted]w[redacted]d being seen today for ongoing prenatal care.  She is currently monitored for the following issues for this low-risk pregnancy and has Supervision of low-risk first pregnancy; Rh negative state in antepartum period; Anemia complicating pregnancy in second trimester; and Carrier of spinal muscular atrophy on their problem list.  Patient reports no complaints.  Contractions: Not present. Vag. Bleeding: None.  Movement: Present. Denies leaking of fluid.   The following portions of the patient's history were reviewed and updated as appropriate: allergies, current medications, past family history, past medical history, past social history, past surgical history and problem list.   Objective:   Vitals:   07/09/23 1459  BP: 110/73  Pulse: 83  Weight: 131 lb (59.4 kg)    Fetal Status: Fetal Heart Rate (bpm): 150 Fundal Height: 38 cm Movement: Present  Presentation: Vertex  General:  Alert, oriented and cooperative. Patient is in no acute distress.  Skin: Skin is warm and dry. No rash noted.   Cardiovascular: Normal heart rate noted  Respiratory: Normal respiratory effort, no problems with respiration noted  Abdomen: Soft, gravid, appropriate for gestational age.  Pain/Pressure: Absent     Pelvic: Cervical exam deferred        Extremities: Normal range of motion.  Edema: None  Mental Status: Normal mood and affect. Normal behavior. Normal judgment and thought content.   Assessment and Plan:  Pregnancy: G1P0000 at [redacted]w[redacted]d 1. [redacted] weeks gestation of pregnancy (Primary)  2. Encounter for supervision of low-risk first pregnancy in third trimester Up to date Fh appropriate Vigorous movement Questions: about timing of induction, when to come to the hospital  3. Anemia complicating pregnancy in second trimester Last hgb 10.1 on 04/28/23, on Fe POC HGB 10.8  4. Rh negative state in antepartum period S/p  rhogam  Preterm labor symptoms and general obstetric precautions including but not limited to vaginal bleeding, contractions, leaking of fluid and fetal movement were reviewed in detail with the patient. Please refer to After Visit Summary for other counseling recommendations.   Return in about 1 week (around 07/16/2023) for Routine prenatal care.  Future Appointments  Date Time Provider Department Center  07/16/2023  2:35 PM Federico Flake, MD [facility]  07/23/2023  3:15 PM [facility] US2 [facility]  07/23/2023  3:55 PM Cresenzo, Cyndi Lennert, MD [facility]    Federico Flake, MD

## 2023-07-10 ENCOUNTER — Encounter: Payer: Self-pay | Admitting: Family Medicine

## 2023-07-15 ENCOUNTER — Telehealth: Payer: Self-pay | Admitting: Family Medicine

## 2023-07-15 NOTE — Telephone Encounter (Signed)
 Called patient to tell her about canceled appt for 2/19 due to the snow weather

## 2023-07-16 ENCOUNTER — Encounter: Payer: Medicaid Other | Admitting: Family Medicine

## 2023-07-17 ENCOUNTER — Inpatient Hospital Stay (HOSPITAL_COMMUNITY): Payer: Medicaid Other

## 2023-07-17 ENCOUNTER — Encounter (HOSPITAL_COMMUNITY): Payer: Self-pay | Admitting: Obstetrics & Gynecology

## 2023-07-17 ENCOUNTER — Other Ambulatory Visit: Payer: Self-pay

## 2023-07-17 ENCOUNTER — Inpatient Hospital Stay (HOSPITAL_COMMUNITY)
Admission: AD | Admit: 2023-07-17 | Discharge: 2023-07-17 | Disposition: A | Discharge status: Home / Self Care | Payer: Medicaid Other | Attending: Obstetrics & Gynecology | Admitting: Obstetrics & Gynecology

## 2023-07-17 DIAGNOSIS — O288 Other abnormal findings on antenatal screening of mother: Secondary | ICD-10-CM

## 2023-07-17 DIAGNOSIS — O471 False labor at or after 37 completed weeks of gestation: Secondary | ICD-10-CM

## 2023-07-17 DIAGNOSIS — O368330 Maternal care for abnormalities of the fetal heart rate or rhythm, third trimester, not applicable or unspecified: Secondary | ICD-10-CM

## 2023-07-17 DIAGNOSIS — Z3A39 39 weeks gestation of pregnancy: Secondary | ICD-10-CM

## 2023-07-17 DIAGNOSIS — O479 False labor, unspecified: Secondary | ICD-10-CM

## 2023-07-17 MED ORDER — MORPHINE SULFATE (PF) 4 MG/ML IV SOLN
4.0000 mg | Freq: Once | INTRAVENOUS | Status: AC
  Administered 2023-07-17: 4 mg via INTRAMUSCULAR
  Filled 2023-07-17: qty 1

## 2023-07-17 MED ORDER — LACTATED RINGERS IV BOLUS
1000.0000 mL | Freq: Once | INTRAVENOUS | Status: AC
  Administered 2023-07-17: 1000 mL via INTRAVENOUS

## 2023-07-17 NOTE — MAU Note (Signed)
.  Margaret Eaton is a 28 y.o. at [redacted]w[redacted]d here in MAU reporting: Contractions   Onset of complaint: cramping started Monday at 1300, off and on getting more painful.  Very small amount of pink/brown discharge. Baby is moving normal amount, denies leaking of fluid.   Pain score: 4 Vitals:   07/17/23 1539  BP: 112/65  Pulse: 93  Resp: 18  Temp: 98.4 F (36.9 C)

## 2023-07-17 NOTE — MAU Provider Note (Signed)
 CTX     S [name] is a 28 y.o. G1P0000 pregnant female at [redacted]w[redacted]d who presents to MAU today with complaint of contractions. Pt states has had irregular contractions with some small pink / brown vaginal discharge.  Endorses +FM, denies LOF.     Receives care at [facility]. Prenatal records reviewed.  Pertinent items noted in HPI and remainder of comprehensive ROS otherwise negative.   O BP 112/65 (BP Location: Right Arm)   Pulse 93   Temp 98.4 F (36.9 C) (Oral)   Resp 18   LMP 10/09/2022 (Within Days)   SpO2 100%  Physical Exam Vitals and nursing note reviewed.  Constitutional:      General: She is not in acute distress.    Appearance: Normal appearance. She is not ill-appearing.  HENT:     Head: Normocephalic and atraumatic.     Right Ear: External ear normal.     Left Ear: External ear normal.     Nose: Nose normal.     Mouth/Throat:     Mouth: Mucous membranes are moist.     Pharynx: Oropharynx is clear.  Eyes:     Extraocular Movements: Extraocular movements intact.     Conjunctiva/sclera: Conjunctivae normal.  Cardiovascular:     Rate and Rhythm: Normal rate.  Pulmonary:     Effort: Pulmonary effort is normal. No respiratory distress.  Abdominal:     General: Bowel sounds are normal.     Palpations: Abdomen is soft.     Comments: gravid  Musculoskeletal:        General: No swelling. Normal range of motion.     Cervical back: Normal range of motion.  Skin:    General: Skin is warm and dry.  Neurological:     Mental Status: She is alert and oriented to person, place, and time. Mental status is at baseline.     Motor: No weakness.     Gait: Gait normal.  Psychiatric:        Mood and Affect: Mood normal.        Behavior: Behavior normal.    NST: 140bpm, moderate variability, +accels, recurrent variable decels, ctx q21mins   MDM: MAU Course:  Pt with recurrent variables on NST.  Pt again denies LOFs.  Spoke with 2nd Attending Dr. Debroah Loop who suggest AFI  to go with my BPP.  Pt agreeable.    BPP 8/10 AFI 8cm, multiple pockets 2x2 per Margaret Eaton   Spoke with Dr. Alvester Morin (1st Attending) and plan to continue monitoring in setting of reassuring BPP.  Will plan to keep on monitor while awaiting repeat cervical check at 2 hour mark (~1730-1800).  If making change or if no break in recurrent variables or other concerning sign of fetal intolerance (ie. Loss of variability) will proceed with IOL.  If recurrent variables have cessation will consider d/c with strict/usual return precautions. Had cessation of decelerations with only fluids and position changes.  CVE showed only mild change from closed to 1/T/-3, went from posterior to anterior position. Spoke with Dr. Donavan Foil (overnight attending) who agrees that patient safe for discharge .    AP #[redacted] weeks gestation #NST with decelerations #False Labor  Will give IM 4mg  Morphine for patient to be comfortable resting at home.  Pt agreeable with plan and understands strict return precautions.   Discharge from MAU in stable condition with strict/usual precautions Follow up at [facility] as scheduled for ongoing prenatal care  Allergies as of 07/17/2023  Reactions   Penicillins Hives   Face and neck        Medication List     TAKE these medications    Doxylamine-Pyridoxine 10-10 MG Tbec Commonly known as: Diclegis Take 2 tabs at bedtime, can add 1 tab in AM and 1 tab in afternoon   IRON PO Take by mouth.   prenatal vitamin w/FE, FA 27-1 MG Tabs tablet Take 1 tablet by mouth daily at 12 noon.        Hessie Dibble, MD 07/17/2023 7:29 PM

## 2023-07-18 ENCOUNTER — Encounter (HOSPITAL_COMMUNITY): Payer: Self-pay | Admitting: Obstetrics and Gynecology

## 2023-07-18 ENCOUNTER — Inpatient Hospital Stay (HOSPITAL_COMMUNITY): Payer: Medicaid Other | Admitting: Anesthesiology

## 2023-07-18 ENCOUNTER — Other Ambulatory Visit: Payer: Self-pay

## 2023-07-18 ENCOUNTER — Ambulatory Visit (INDEPENDENT_AMBULATORY_CARE_PROVIDER_SITE_OTHER): Payer: Medicaid Other | Admitting: Family Medicine

## 2023-07-18 ENCOUNTER — Inpatient Hospital Stay (HOSPITAL_COMMUNITY)
Admission: AD | Admit: 2023-07-18 | Discharge: 2023-07-18 | Disposition: A | Discharge status: Home / Self Care | Payer: Medicaid Other | Source: Home / Self Care | Attending: Obstetrics and Gynecology | Admitting: Obstetrics and Gynecology

## 2023-07-18 ENCOUNTER — Inpatient Hospital Stay (HOSPITAL_COMMUNITY)
Admission: AD | Admit: 2023-07-18 | Discharge: 2023-07-21 | DRG: 787 | Disposition: A | Discharge status: Home / Self Care | Payer: Medicaid Other | Attending: Obstetrics and Gynecology | Admitting: Obstetrics and Gynecology

## 2023-07-18 ENCOUNTER — Encounter (HOSPITAL_COMMUNITY): Payer: Self-pay | Admitting: Obstetrics & Gynecology

## 2023-07-18 ENCOUNTER — Encounter: Payer: Self-pay | Admitting: Family Medicine

## 2023-07-18 ENCOUNTER — Encounter (HOSPITAL_COMMUNITY)
Admission: AD | Disposition: A | Discharge status: Home / Self Care | Payer: Self-pay | Source: Home / Self Care | Attending: Obstetrics and Gynecology

## 2023-07-18 VITALS — BP 114/75 | HR 82 | Wt 135.3 lb

## 2023-07-18 DIAGNOSIS — Z3A39 39 weeks gestation of pregnancy: Secondary | ICD-10-CM

## 2023-07-18 DIAGNOSIS — O471 False labor at or after 37 completed weeks of gestation: Secondary | ICD-10-CM | POA: Insufficient documentation

## 2023-07-18 DIAGNOSIS — Z3A00 Weeks of gestation of pregnancy not specified: Secondary | ICD-10-CM | POA: Diagnosis not present

## 2023-07-18 DIAGNOSIS — O9912 Other diseases of the blood and blood-forming organs and certain disorders involving the immune mechanism complicating childbirth: Secondary | ICD-10-CM | POA: Diagnosis not present

## 2023-07-18 DIAGNOSIS — Z3403 Encounter for supervision of normal first pregnancy, third trimester: Secondary | ICD-10-CM

## 2023-07-18 DIAGNOSIS — Z833 Family history of diabetes mellitus: Secondary | ICD-10-CM

## 2023-07-18 DIAGNOSIS — Z3689 Encounter for other specified antenatal screening: Secondary | ICD-10-CM | POA: Insufficient documentation

## 2023-07-18 DIAGNOSIS — O479 False labor, unspecified: Secondary | ICD-10-CM

## 2023-07-18 DIAGNOSIS — Z23 Encounter for immunization: Secondary | ICD-10-CM | POA: Diagnosis not present

## 2023-07-18 DIAGNOSIS — O26893 Other specified pregnancy related conditions, third trimester: Secondary | ICD-10-CM

## 2023-07-18 DIAGNOSIS — Z6791 Unspecified blood type, Rh negative: Secondary | ICD-10-CM

## 2023-07-18 DIAGNOSIS — O4103X0 Oligohydramnios, third trimester, not applicable or unspecified: Secondary | ICD-10-CM

## 2023-07-18 DIAGNOSIS — O26899 Other specified pregnancy related conditions, unspecified trimester: Secondary | ICD-10-CM

## 2023-07-18 DIAGNOSIS — D696 Thrombocytopenia, unspecified: Secondary | ICD-10-CM | POA: Diagnosis not present

## 2023-07-18 DIAGNOSIS — O99012 Anemia complicating pregnancy, second trimester: Secondary | ICD-10-CM

## 2023-07-18 DIAGNOSIS — Z880 Allergy status to penicillin: Secondary | ICD-10-CM

## 2023-07-18 DIAGNOSIS — Z148 Genetic carrier of other disease: Secondary | ICD-10-CM | POA: Diagnosis not present

## 2023-07-18 DIAGNOSIS — O480 Post-term pregnancy: Secondary | ICD-10-CM | POA: Diagnosis present

## 2023-07-18 DIAGNOSIS — O99013 Anemia complicating pregnancy, third trimester: Secondary | ICD-10-CM

## 2023-07-18 LAB — URINALYSIS, ROUTINE W REFLEX MICROSCOPIC
Bilirubin Urine: NEGATIVE
Glucose, UA: NEGATIVE mg/dL
Ketones, ur: NEGATIVE mg/dL
Leukocytes,Ua: NEGATIVE
Nitrite: NEGATIVE
Protein, ur: NEGATIVE mg/dL
Specific Gravity, Urine: 1.004 — ABNORMAL LOW (ref 1.005–1.030)
pH: 6 (ref 5.0–8.0)

## 2023-07-18 LAB — CBC
HCT: 35.4 % — ABNORMAL LOW (ref 36.0–46.0)
Hemoglobin: 11.6 g/dL — ABNORMAL LOW (ref 12.0–15.0)
MCH: 30.8 pg (ref 26.0–34.0)
MCHC: 32.8 g/dL (ref 30.0–36.0)
MCV: 93.9 fL (ref 80.0–100.0)
Platelets: 115 10*3/uL — ABNORMAL LOW (ref 150–400)
RBC: 3.77 MIL/uL — ABNORMAL LOW (ref 3.87–5.11)
RDW: 13.1 % (ref 11.5–15.5)
WBC: 8.8 10*3/uL (ref 4.0–10.5)
nRBC: 0 % (ref 0.0–0.2)

## 2023-07-18 LAB — TYPE AND SCREEN
ABO/RH(D): A NEG
Antibody Screen: POSITIVE

## 2023-07-18 SURGERY — Surgical Case
Anesthesia: Epidural

## 2023-07-18 MED ORDER — SOD CITRATE-CITRIC ACID 500-334 MG/5ML PO SOLN
30.0000 mL | ORAL | Status: DC | PRN
  Filled 2023-07-18: qty 30

## 2023-07-18 MED ORDER — OXYTOCIN-SODIUM CHLORIDE 30-0.9 UT/500ML-% IV SOLN
1.0000 m[IU]/min | INTRAVENOUS | Status: DC
  Administered 2023-07-18: 2 m[IU]/min via INTRAVENOUS

## 2023-07-18 MED ORDER — FENTANYL CITRATE (PF) 250 MCG/5ML IJ SOLN
INTRAMUSCULAR | Status: AC
  Filled 2023-07-18: qty 5

## 2023-07-18 MED ORDER — MORPHINE SULFATE (PF) 0.5 MG/ML IJ SOLN
INTRAMUSCULAR | Status: AC
Start: 2023-07-18 — End: ?
  Filled 2023-07-18: qty 10

## 2023-07-18 MED ORDER — ONDANSETRON HCL 4 MG/2ML IJ SOLN
INTRAMUSCULAR | Status: AC
  Filled 2023-07-18: qty 2

## 2023-07-18 MED ORDER — DIPHENHYDRAMINE HCL 25 MG PO CAPS
25.0000 mg | ORAL_CAPSULE | ORAL | Status: DC | PRN
  Filled 2023-07-18: qty 1

## 2023-07-18 MED ORDER — MORPHINE SULFATE (PF) 0.5 MG/ML IJ SOLN
INTRAMUSCULAR | Status: DC | PRN
  Administered 2023-07-18: 3 mg via EPIDURAL

## 2023-07-18 MED ORDER — TERBUTALINE SULFATE 1 MG/ML IJ SOLN
0.2500 mg | Freq: Once | INTRAMUSCULAR | Status: AC
  Administered 2023-07-18: 0.25 mg via SUBCUTANEOUS

## 2023-07-18 MED ORDER — FENTANYL CITRATE (PF) 250 MCG/5ML IJ SOLN
INTRAMUSCULAR | Status: DC | PRN
  Administered 2023-07-18: 100 ug via INTRAVENOUS

## 2023-07-18 MED ORDER — LACTATED RINGERS IV SOLN
500.0000 mL | Freq: Once | INTRAVENOUS | Status: DC
Start: 2023-07-18 — End: 2023-07-19

## 2023-07-18 MED ORDER — ONDANSETRON HCL 4 MG/2ML IJ SOLN
4.0000 mg | Freq: Four times a day (QID) | INTRAMUSCULAR | Status: DC | PRN
Start: 2023-07-18 — End: 2023-07-19

## 2023-07-18 MED ORDER — LIDOCAINE-EPINEPHRINE (PF) 2 %-1:200000 IJ SOLN
INTRAMUSCULAR | Status: AC
  Filled 2023-07-18: qty 20

## 2023-07-18 MED ORDER — NALOXONE HCL 0.4 MG/ML IJ SOLN: 0.4000 mg | INTRAMUSCULAR | Status: DC | PRN

## 2023-07-18 MED ORDER — SODIUM CHLORIDE 0.9 % IR SOLN
Status: DC | PRN
  Administered 2023-07-18: 1

## 2023-07-18 MED ORDER — EPHEDRINE 5 MG/ML INJ: 10.0000 mg | INTRAVENOUS | Status: DC | PRN

## 2023-07-18 MED ORDER — LIDOCAINE HCL (PF) 1 % IJ SOLN
INTRAMUSCULAR | Status: DC | PRN
  Administered 2023-07-18: 11 mL via EPIDURAL

## 2023-07-18 MED ORDER — CEFAZOLIN SODIUM-DEXTROSE 2-4 GM/100ML-% IV SOLN: 2.0000 g | INTRAVENOUS | Status: DC

## 2023-07-18 MED ORDER — LACTATED RINGERS IV SOLN: INTRAVENOUS | Status: DC

## 2023-07-18 MED ORDER — OXYTOCIN BOLUS FROM INFUSION: 333.0000 mL | Freq: Once | INTRAVENOUS | Status: DC

## 2023-07-18 MED ORDER — TERBUTALINE SULFATE 1 MG/ML IJ SOLN
INTRAMUSCULAR | Status: AC
Start: 2023-07-18 — End: 2023-07-18
  Administered 2023-07-18: 0.25 mg via SUBCUTANEOUS
  Filled 2023-07-18: qty 1

## 2023-07-18 MED ORDER — OXYTOCIN-SODIUM CHLORIDE 30-0.9 UT/500ML-% IV SOLN
2.5000 [IU]/h | INTRAVENOUS | Status: DC
  Filled 2023-07-18: qty 500

## 2023-07-18 MED ORDER — LIDOCAINE-EPINEPHRINE (PF) 2 %-1:200000 IJ SOLN
INTRAMUSCULAR | Status: DC | PRN
  Administered 2023-07-18: 10 mL via EPIDURAL

## 2023-07-18 MED ORDER — METHYLERGONOVINE MALEATE 0.2 MG/ML IJ SOLN
INTRAMUSCULAR | Status: DC | PRN
  Administered 2023-07-18: .2 mg via INTRAMUSCULAR

## 2023-07-18 MED ORDER — PHENYLEPHRINE 80 MCG/ML (10ML) SYRINGE FOR IV PUSH (FOR BLOOD PRESSURE SUPPORT)
80.0000 ug | PREFILLED_SYRINGE | INTRAVENOUS | Status: DC | PRN
  Filled 2023-07-18: qty 10

## 2023-07-18 MED ORDER — DEXAMETHASONE SODIUM PHOSPHATE 10 MG/ML IJ SOLN
INTRAMUSCULAR | Status: AC
  Filled 2023-07-18: qty 1

## 2023-07-18 MED ORDER — ACETAMINOPHEN 325 MG PO TABS: 650.0000 mg | ORAL_TABLET | ORAL | Status: DC | PRN

## 2023-07-18 MED ORDER — FENTANYL-BUPIVACAINE-NACL 0.5-0.125-0.9 MG/250ML-% EP SOLN
12.0000 mL/h | EPIDURAL | Status: DC | PRN
  Administered 2023-07-18: 12 mL/h via EPIDURAL
  Filled 2023-07-18: qty 250

## 2023-07-18 MED ORDER — LIDOCAINE HCL (PF) 1 % IJ SOLN: 30.0000 mL | INTRAMUSCULAR | Status: DC | PRN

## 2023-07-18 MED ORDER — PHENYLEPHRINE 80 MCG/ML (10ML) SYRINGE FOR IV PUSH (FOR BLOOD PRESSURE SUPPORT): 80.0000 ug | PREFILLED_SYRINGE | INTRAVENOUS | Status: DC | PRN

## 2023-07-18 MED ORDER — FENTANYL CITRATE (PF) 100 MCG/2ML IJ SOLN: 50.0000 ug | INTRAMUSCULAR | Status: DC | PRN

## 2023-07-18 MED ORDER — SODIUM CHLORIDE 0.9 % IV SOLN
INTRAVENOUS | Status: AC
  Filled 2023-07-18: qty 5

## 2023-07-18 MED ORDER — SODIUM CHLORIDE 0.9% FLUSH: 3.0000 mL | INTRAVENOUS | Status: DC | PRN

## 2023-07-18 MED ORDER — FENTANYL CITRATE (PF) 100 MCG/2ML IJ SOLN
INTRAMUSCULAR | Status: AC
  Filled 2023-07-18: qty 2

## 2023-07-18 MED ORDER — STERILE WATER FOR IRRIGATION IR SOLN
Status: DC | PRN
Start: 2023-07-18 — End: 2023-07-18
  Administered 2023-07-18: 1

## 2023-07-18 MED ORDER — TERBUTALINE SULFATE 1 MG/ML IJ SOLN
0.2500 mg | Freq: Once | INTRAMUSCULAR | Status: AC | PRN
  Filled 2023-07-18: qty 1

## 2023-07-18 MED ORDER — FENTANYL CITRATE (PF) 100 MCG/2ML IJ SOLN
INTRAMUSCULAR | Status: DC | PRN
  Administered 2023-07-18: 100 ug via EPIDURAL

## 2023-07-18 MED ORDER — NALOXONE HCL 4 MG/10ML IJ SOLN: 1.0000 ug/kg/h | INTRAVENOUS | Status: DC | PRN

## 2023-07-18 MED ORDER — LACTATED RINGERS IV SOLN: 500.0000 mL | INTRAVENOUS | Status: DC | PRN

## 2023-07-18 MED ORDER — DIPHENHYDRAMINE HCL 50 MG/ML IJ SOLN: 12.5000 mg | INTRAMUSCULAR | Status: DC | PRN

## 2023-07-18 MED ORDER — METHYLERGONOVINE MALEATE 0.2 MG/ML IJ SOLN
INTRAMUSCULAR | Status: AC
  Filled 2023-07-18: qty 1

## 2023-07-18 MED ORDER — PHENYLEPHRINE 80 MCG/ML (10ML) SYRINGE FOR IV PUSH (FOR BLOOD PRESSURE SUPPORT)
PREFILLED_SYRINGE | INTRAVENOUS | Status: DC | PRN
  Administered 2023-07-18: 80 ug via INTRAVENOUS

## 2023-07-18 MED ORDER — ONDANSETRON HCL 4 MG/2ML IJ SOLN
INTRAMUSCULAR | Status: DC | PRN
  Administered 2023-07-18: 4 mg via INTRAVENOUS

## 2023-07-18 MED ORDER — OXYTOCIN-SODIUM CHLORIDE 30-0.9 UT/500ML-% IV SOLN
INTRAVENOUS | Status: DC | PRN
  Administered 2023-07-18: 30 [IU] via INTRAVENOUS

## 2023-07-18 MED ORDER — SOD CITRATE-CITRIC ACID 500-334 MG/5ML PO SOLN
30.0000 mL | ORAL | Status: AC
  Administered 2023-07-18: 30 mL via ORAL

## 2023-07-18 MED ORDER — SODIUM CHLORIDE 0.9 % IV SOLN
500.0000 mg | INTRAVENOUS | Status: AC
  Administered 2023-07-18: 500 mg via INTRAVENOUS

## 2023-07-18 SURGICAL SUPPLY — 34 items
BENZOIN TINCTURE PRP APPL 2/3 (GAUZE/BANDAGES/DRESSINGS) ×1 IMPLANT
CANISTER SUCT 3000ML PPV (MISCELLANEOUS) ×1 IMPLANT
CHLORAPREP W/TINT 26 (MISCELLANEOUS) ×2 IMPLANT
CLAMP UMBILICAL CORD (MISCELLANEOUS) ×1 IMPLANT
DERMABOND ADVANCED .7 DNX12 (GAUZE/BANDAGES/DRESSINGS) IMPLANT
DERMABOND ADVANCED .7 DNX6 (GAUZE/BANDAGES/DRESSINGS) IMPLANT
DRSG OPSITE POSTOP 4X10 (GAUZE/BANDAGES/DRESSINGS) ×1 IMPLANT
ELECT REM PT RETURN 9FT ADLT (ELECTROSURGICAL) ×1 IMPLANT
ELECTRODE REM PT RTRN 9FT ADLT (ELECTROSURGICAL) ×1 IMPLANT
EXTRACTOR VACUUM KIWI (MISCELLANEOUS) ×1 IMPLANT
GAUZE SPONGE 4X4 12PLY STRL LF (GAUZE/BANDAGES/DRESSINGS) IMPLANT
GLOVE BIOGEL PI IND STRL 7.0 (GLOVE) ×2 IMPLANT
GLOVE BIOGEL PI IND STRL 7.5 (GLOVE) ×1 IMPLANT
GLOVE SURG SS PI 7.0 STRL IVOR (GLOVE) ×1 IMPLANT
GOWN STRL REUS W/ TWL LRG LVL3 (GOWN DISPOSABLE) ×2 IMPLANT
GOWN STRL REUS W/ TWL XL LVL3 (GOWN DISPOSABLE) ×1 IMPLANT
NDL SPNL 22GX7 QUINCKE BK (NEEDLE) IMPLANT
NEEDLE SPNL 22GX7 QUINCKE BK (NEEDLE) ×1 IMPLANT
NS IRRIG 1000ML POUR BTL (IV SOLUTION) ×1 IMPLANT
PACK C SECTION WH (CUSTOM PROCEDURE TRAY) ×1 IMPLANT
PAD ABD 7.5X8 STRL (GAUZE/BANDAGES/DRESSINGS) ×1 IMPLANT
PAD OB MATERNITY 4.3X12.25 (PERSONAL CARE ITEMS) ×1 IMPLANT
PAD PREP 24X48 CUFFED NSTRL (MISCELLANEOUS) ×1 IMPLANT
RETRACTOR WND ALEXIS 25 LRG (MISCELLANEOUS) ×1 IMPLANT
RTRCTR WOUND ALEXIS 25CM LRG (MISCELLANEOUS) ×1 IMPLANT
STRIP CLOSURE SKIN 1/2X4 (GAUZE/BANDAGES/DRESSINGS) ×1 IMPLANT
SUT MNCRL 0 VIOLET CTX 36 (SUTURE) ×2 IMPLANT
SUT MON AB 4-0 PS1 27 (SUTURE) ×1 IMPLANT
SUT PLAIN 2 0 XLH (SUTURE) ×1 IMPLANT
SUT VIC AB 0 CT1 36 (SUTURE) ×2 IMPLANT
SUT VIC AB 3-0 CT1 TAPERPNT 27 (SUTURE) ×1 IMPLANT
SUT VIC AB 4-0 KS 27 (SUTURE) IMPLANT
TOWEL OR 17X24 6PK STRL BLUE (TOWEL DISPOSABLE) ×2 IMPLANT
WATER STERILE IRR 1000ML POUR (IV SOLUTION) ×1 IMPLANT

## 2023-07-18 NOTE — Lactation Note (Deleted)
 This note was copied from a baby's chart. Lactation Consultation Note  Patient Name: Boy Carmelia Tiner [id number] Date: 07/18/2023 Age:27 hours  Mom told RN that she would like to see Lactation but not tonight. Would like to see Lactation tomorrow.    Maternal Data    Feeding    LATCH Score                    Lactation Tools Discussed/Used    Interventions    Discharge    Consult Status      Charyl Dancer 07/18/2023, 10:07 PM

## 2023-07-18 NOTE — Op Note (Signed)
 Operative Note   SURGERY DATE: 07/18/2023  PRE-OP DIAGNOSIS:  *Pregnancy at 39/4 *Fetal intolerance of labor at 2cm *Oligohydramnios *Moderate meconium  POST-OP DIAGNOSIS: Same.   PROCEDURE: stat primary low transverse cesarean section via pfannenstiel skin incision with double layer uterine closure  SURGEON: Surgeons and Role:    * Renfrow Bing, MD - Primary  ASSISTANT:    Joanne Gavel, MD - Assisting  An experienced assistant was required given the standard of surgical care given the complexity of the case.  This assistant was needed for exposure, dissection, suctioning, retraction, instrument exchange, assisting with delivery with administration of fundal pressure, and for overall help during the procedure.  ANESTHESIA: epidural to general  ESTIMATED BLOOD LOSS:   DRAINS: UOP via indwelling foley  TOTAL IV FLUIDS: crystalloid  VTE PROPHYLAXIS: SCDs to bilateral lower extremities  ANTIBIOTICS: Two grams of Cefazolin and azithromycin 500mg  IV x 1 intra-operatively  SPECIMENS: placenta to pathology  COMPLICATIONS: FHR in the 60s in the OR and epidural not adequate so general needed.   INDICATIONS: patient admitted from [facility] triage after negative labor evaluation of 2cm but with 8 minute deceleration. On L&D, baby looked fine and patient started on 2mU of pitocin but had worsening decelerations despite resuscitative efforts. Cervix unchanged and moderate meconium noted and concern for oligohydramnios with AROM done to place fetal scalp electrode. Given this, patient was amenable to c-section  FINDINGS: No intra-abdominal adhesions were noted. Grossly normal uterus, tubes and ovaries. Scant amount of moderate meconium stained amniotic fluid, cephalic, female infant, weight 1610RU, APGARs 9/9, intact placenta. Arterial: pH 7.26, CO2 50, Bicarb 22.4, A-B deficit 5 Venous: pH 7.25, CO2 58, Bicarb 25.4, A-B deficit 3.1  PROCEDURE IN DETAIL: The patient  was taken to the operating room, and patient moved to OR bed. FHR was in the 60s so stat c-section called. Epidural not adequate so general done; she was splash prepped and draped in the normal fashion in the dorsal supine position with a leftward tilt during this time.  At anesthesia's okay, we started the surgery, and a Joel-Cohen skin incision was made with the scalpel and carried through to the underlying layer of fascia. The fascia was then incised at the midline and manual the fascia and abdomen entered.  The bladder blade was inserted and the vesicouterine peritoneum was identified.  A low transverse hysterotomy was made with the scalpel until the endometrial cavity was breached and the amniotic sac ruptured, yielding scant moderate meconium stained amniotic fluid. This incision was extended bluntly and the infant's head, shoulders and body were delivered atraumatically.The cord was clamped x 2 and cut, and the infant was handed to the awaiting pediatricians, after delayed cord clamping was not done; the baby was crying and vigorous at birth.  The placenta was then gradually expressed from the uterus and then the uterus was exteriorized and cleared of all clots and debris. The hysterotomy was repaired with a running suture of 1-0 monocryl. A second imbricating layer of 1-0 monocryl suture was then placed to achieve excellent hemostasis.   The uterus and adnexa were then returned to the abdomen, and the hysterotomy and all operative sites were reinspected and excellent hemostasis was noted after irrigation and suction of the abdomen with warm saline.  The peritoneum was closed with a running stitch of 3-0 Vicryl. The fascia was reapproximated with 0 Vicryl in a simple running fashion bilaterally.  The skin was then closed with 4-0 vicryl, in a  subcuticular fashion.  The patient  tolerated the procedure well. Sponge, lap, needle, and instrument counts were correct x 2. The patient was transferred to the  recovery room awake, alert and breathing independently in stable condition.  Cornelia Copa MD Attending Center for [facility])

## 2023-07-18 NOTE — Patient Instructions (Signed)

## 2023-07-18 NOTE — Progress Notes (Signed)
   Subjective:  Margaret Eaton is a 28 y.o. G1P0000 at [redacted]w[redacted]d being seen today for ongoing prenatal care.  She is currently monitored for the following issues for this low-risk pregnancy and has Supervision of low-risk first pregnancy; Rh negative state in antepartum period; Anemia complicating pregnancy in second trimester; and Carrier of spinal muscular atrophy on their problem list.  Patient reports contractions since last night .  Contractions: Irritability. Vag. Bleeding: Bloody Show.  Movement: Present. Denies leaking of fluid.   The following portions of the patient's history were reviewed and updated as appropriate: allergies, current medications, past family history, past medical history, past social history, past surgical history and problem list. Problem list updated.  Objective:   Vitals:   07/18/23 1037  BP: 114/75  Pulse: 82  Weight: 135 lb 4.8 oz (61.4 kg)    Fetal Status: Fetal Heart Rate (bpm): 111   Movement: Present  Presentation: Vertex  General:  Alert, oriented and cooperative. Patient is in no acute distress.  Skin: Skin is warm and dry. No rash noted.   Cardiovascular: Normal heart rate noted  Respiratory: Normal respiratory effort, no problems with respiration noted  Abdomen: Soft, gravid, appropriate for gestational age. Pain/Pressure: Present     Pelvic: Vag. Bleeding: Bloody Show     Cervical exam performed Dilation: 1 Effacement (%): 70 Station: -1  Extremities: Normal range of motion.  Edema: None  Mental Status: Normal mood and affect. Normal behavior. Normal judgment and thought content.   Urinalysis:      Assessment and Plan:  Pregnancy: G1P0000 at [redacted]w[redacted]d  1. Encounter for supervision of low-risk first pregnancy in third trimester (Primary) BP and FHR normal Discussed post dates testing and IOL, both scheduled Form faxed, orders placed Membranes swept per request, bloody show, reviewed labor precautions  2. Rh negative state in antepartum  period S/p rhogam 04/27/2024  3. Anemia complicating pregnancy in second trimester Lab Results  Component Value Date   HGB 10.8 (L) 07/09/2023   Much improved s/p IV iron  Term labor symptoms and general obstetric precautions including but not limited to vaginal bleeding, contractions, leaking of fluid and fetal movement were reviewed in detail with the patient. Please refer to After Visit Summary for other counseling recommendations.  Return in about 1 week (around 07/25/2023) for Dyad patient, ob visit, post-dates testing.   Venora Maples, MD

## 2023-07-18 NOTE — Progress Notes (Signed)
 OB note Went to see patient due to lates and currently with a prolonged decel; pt already status post terb. FHR in the 100s when I came in and I did an SVE and patient unchanged at 2cm/50/-3 with small BOW noted; patient AROM with scant fluid with moderate mec>>FSE placed and FHR in the 120s and toco negative; epidural working well and no e/o hypotension.   Patient's belly is small and scant fluid on AROM. D/w her that fetal decel was reason for admission and with cervix the same, concerned that baby will be unable to tolerate active labor and risk of emergency c/s. FHR okay now and I recommend c/s for fetal intolerance; also concern for SGA and oligo given exam. Pt amenable to c/s.   Cornelia Copa MD Attending Center for [employer] (Faculty Practice) 07/18/2023 Time: 2100

## 2023-07-18 NOTE — H&P (Signed)
 OBSTETRIC ADMISSION HISTORY AND PHYSICAL  Wakisha Alberts is a 28 y.o. female G1P0000 with IUP at [redacted]w[redacted]d by [place] CRL presenting for contractions. She had a 8 minute prolonged deceleration in MAU. She reports +FMs, No LOF, no VB, no blurry vision, headaches or peripheral edema, and RUQ pain.  She plans on breast and formula feeding. She declines birth control at this time. 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 Dating by LMP  [facility] Model [facility] Anatomy U/S Normal, f/u as indicated  Initiated care at  [employer]  English              LAB RESULTS   Support Person Mom, [name] Genetics NIPS: LR female AFP: not done    NT/IT (FT only)     Carrier Screen Horizon: SMA carrier  Rhogam  Given 04/27/2024 A1C/GTT Early: normal Third trimester: normal  Flu Vaccine Declined     TDaP Vaccine  Not given Blood Type A/Negative/-- (08/28 1334)  Covid Vaccine none Antibody Negative (08/28 1334)  RSV Vaccine Not given Rubella 5.77 (08/28 1334)  Feeding Plan both RPR Non Reactive (12/02 0825)  Contraception condoms HBsAg Negative (08/28 1334)  Circumcision If boy- YES HIV Non Reactive (12/02 0825)  Pediatrician  MBCC HCVAb Non Reactive (08/28 1334)  Prenatal Classes       Pap Diagnosis  Date Value Ref Range Status  01/22/2023   Final   - Negative for intraepithelial lesion or malignancy (NILM)    BTLConsent  GC/CT Initial:  neg x2 36wks:  neg/neg  VBAC  Consent  GBS Negative/-- (01/27 1706)        DME Rx [ ]  BP cuff [ ]  Weight Scale Waterbirth  [ ]  Class [ ]  Consent [ ]  CNM visit  PHQ9 & GAD7 [ x ] new OB [  ] 28 weeks  [  ] 36 weeks Induction  [ ]  Orders Entered [ ] Foley Y/N     Past Medical History: Past Medical History:  Diagnosis Date    Medical history non-contributory    Menometrorrhagia 02/19/2013   Pilonidal cyst     Past Surgical History: Past Surgical History:  Procedure Laterality Date   NO PAST SURGERIES      Obstetrical History: OB History     Gravida  1   Para  0   Term  0   Preterm  0   AB  0   Living  0      SAB  0   IAB  0   Ectopic  0   Multiple  0   Live Births  0           Social History Social History   Socioeconomic History   Marital status: Married    Spouse name: Not on file   Number of children: Not on file   Years of education: Not on file   Highest education level: Not on file  Occupational History   Not on file  Tobacco Use   Smoking status: Never   Smokeless tobacco: Never  Vaping Use   Vaping status: Never Used  Substance and Sexual Activity   Alcohol use: No  Drug use: Never   Sexual activity: Not Currently  Other Topics Concern   Not on file  Social History Narrative   Not on file   Social Drivers of Health   Financial Resource Strain: Not on file  Food Insecurity: No Food Insecurity (07/18/2023)   Hunger Vital Sign    Worried About Running Out of Food in the Last Year: Never true    Ran Out of Food in the Last Year: Never true  Transportation Needs: No Transportation Needs (07/18/2023)   PRAPARE - Administrator, Civil Service (Medical): No    Lack of Transportation (Non-Medical): No  Physical Activity: Not on file  Stress: Not on file  Social Connections: Not on file    Family History: Family History  Problem Relation Age of Onset   Diabetes Mother    Aplastic anemia Father    Hyperlipidemia Maternal Grandmother    Atrial fibrillation Maternal Grandmother     Allergies: Allergies  Allergen Reactions   Penicillins Hives    Face and neck    Medications Prior to Admission  Medication Sig Dispense Refill Last Dose/Taking   prenatal vitamin w/FE, FA (PRENATAL 1 + 1) 27-1 MG TABS tablet Take 1 tablet by mouth daily  at 12 noon. 30 tablet 11 07/17/2023 Morning     Review of Systems   All systems reviewed and negative except as stated in HPI  Blood pressure 115/63, pulse 90, temperature 98.2 F (36.8 C), temperature source Oral, resp. rate 17, height 5\' 2"  (1.575 m), weight 60.8 kg, last menstrual period 10/09/2022, SpO2 100%. General appearance: alert, cooperative, and no distress Lungs: clear to auscultation bilaterally Heart: regular rate and rhythm Abdomen: soft, non-tender; bowel sounds normal Pelvic: 2/80/-2 Extremities: Homans sign is negative, no sign of DVT Presentation: cephalic Fetal monitoringBaseline: 135 bpm, Variability: Good {> 6 bpm), Accelerations: Reactive, and Decelerations: Absent Uterine activityFrequency: Every 2-6 minutes, Duration: 60-90 seconds, and Intensity: mild-mod Dilation: 2 Effacement (%): 80 Station: -2 Exam by:: Quin Hoop SNM   Prenatal labs: ABO, Rh: --/--/PENDING (02/21 1747) Antibody: PENDING (02/21 1747) Rubella: 5.77 (08/28 1334) RPR: Non Reactive (12/02 0825)  HBsAg: Negative (08/28 1334)  HIV: Non Reactive (12/02 0825)  GBS: Negative/-- (01/27 1706)    Lab Results  Component Value Date   GBS Negative 06/23/2023   GTT WNL Genetic screening: SMA carrier Anatomy US WNL  Immunization History  Administered Date(s) Administered   DTaP 08/16/1996, 11/05/1996, 12/22/1996, 12/09/1997   H1N1 04/20/2008   HIB (PRP-OMP) 08/16/1996, 11/05/1996, 12/22/1996, 12/09/1997   HPV Quadrivalent 03/20/2007, 08/06/2007, 03/08/2009   Hepatitis A 04/11/2010   Hepatitis B 08-22-1996, 08/16/1996, 12/22/1996   Hpv-Unspecified 03/20/2007, 08/06/2007   IPV 08/16/1996, 11/05/1996, 06/15/1997   Influenza Split 05/07/2011, 02/19/2013   Influenza Whole 03/20/2007, 02/18/2008, 04/20/2008   Influenza,inj,Quad PF,6+ Mos 02/19/2013, 03/15/2015, 04/08/2017   MMR 06/15/1997, 01/11/2015   Meningococcal Conjugate 04/11/2010, 01/11/2015   PPD Test 12/01/2015, 01/05/2016    Td 01/18/2008   Tdap 01/18/2008   Varicella 06/15/1997, 01/11/2015    Prenatal Transfer Tool  Maternal Diabetes: No Genetic Screening: SMA carrier Maternal Ultrasounds/Referrals: Normal Fetal Ultrasounds or other Referrals:  None Maternal Substance Abuse:  No Significant Maternal Medications:  None Significant Maternal Lab Results: Group B Strep negative and Rh negative Number of Prenatal Visits:greater than 3 verified prenatal visits Maternal Vaccinations: None Other Comments:  None   Results for orders placed or performed during the hospital encounter of 07/18/23 (from the past 24 hours)  Urinalysis,  Routine w reflex microscopic -Urine, Clean Catch   Collection Time: 07/18/23  4:15 PM  Result Value Ref Range   Color, Urine STRAW (A) YELLOW   APPearance CLEAR CLEAR   Specific Gravity, Urine 1.004 (L) 1.005 - 1.030   pH 6.0 5.0 - 8.0   Glucose, UA NEGATIVE NEGATIVE mg/dL   Hgb urine dipstick SMALL (A) NEGATIVE   Bilirubin Urine NEGATIVE NEGATIVE   Ketones, ur NEGATIVE NEGATIVE mg/dL   Protein, ur NEGATIVE NEGATIVE mg/dL   Nitrite NEGATIVE NEGATIVE   Leukocytes,Ua NEGATIVE NEGATIVE   RBC / HPF 0-5 0 - 5 RBC/hpf   WBC, UA 0-5 0 - 5 WBC/hpf   Bacteria, UA RARE (A) NONE SEEN   Squamous Epithelial / HPF 0-5 0 - 5 /HPF  Type and screen   Collection Time: 07/18/23  5:47 PM  Result Value Ref Range   ABO/RH(D) PENDING    Antibody Screen PENDING    Sample Expiration      07/21/2023,2359 Performed at [facility], [address], [place], [place] [address]     Patient Active Problem List   Diagnosis Date Noted   Post-dates pregnancy 07/18/2023   Carrier of spinal muscular atrophy 02/02/2023   Rh negative state in antepartum period 01/23/2023   Anemia complicating pregnancy in second trimester 01/23/2023   Supervision of low-risk first pregnancy 01/08/2023    Assessment/Plan:  Abigayl Hor is a 28 y.o. G1P0000 at [redacted]w[redacted]d here for IOL for contractions with  prolonged deceleration in MAU. Cat 2 FHT due to questionable variables. Overall reassured by moderate variability and prompt return to baseline. [name] aware.  #Labor: Pitocin 2x2, hold at 6mu #Pain: IV pain meds, Epidural upon request. #FWB: Cat 2, reassured by prompt return to baseline and moderate variability.. #GBS status:  negative #Feeding: Breastmilk  and Formula #Reproductive Life planning: None #Circ:  yes  Elige Ko, Student-MidWife  07/18/2023, 6:13 PM

## 2023-07-18 NOTE — Anesthesia Procedure Notes (Signed)
 Procedure Name: Intubation Date/Time: 07/18/2023 9:31 PM  Performed by: Elgie Congo, CRNAPre-anesthesia Checklist: Patient identified, Emergency Drugs available, Suction available and Patient being monitored Patient Re-evaluated:Patient Re-evaluated prior to induction Oxygen Delivery Method: Circle system utilized Preoxygenation: Pre-oxygenation with 100% oxygen Induction Type: IV induction and Rapid sequence Laryngoscope Size: Mac and 3 Grade View: Grade I Tube type: Oral Tube size: 7.0 mm Number of attempts: 1 Airway Equipment and Method: Stylet Placement Confirmation: ETT inserted through vocal cords under direct vision, positive ETCO2 and breath sounds checked- equal and bilateral Secured at: 21 cm Tube secured with: Tape Dental Injury: Teeth and Oropharynx as per pre-operative assessment

## 2023-07-18 NOTE — Progress Notes (Signed)
 Labor Progress Note [name] is a 28 y.o. G1P0000 at [redacted]w[redacted]d presented for contractions. Her fetus subsequently had an eight minute prolonged deceleration in MAU so decision to admit to L&D was made.   S:  Coping well s/p epidural  O:  BP 120/85   Pulse (!) 175   Temp 98.2 F (36.8 C) (Oral)   Resp 18   Ht 5\' 2"  (1.575 m)   Wt 60.8 kg   LMP 10/09/2022 (Within Days)   SpO2 100%   BMI 24.51 kg/m  EFM: baseline 125 bpm/ mod variability/ 15x15 accels/ recurrent late & prolonged decels  Toco/IUPC: 3-5 SVE: Dilation: 2 Effacement (%): 50 Station: -2 Presentation: Vertex Exam by:: [name] Pitocin: 2 mu/min turned off while CNM in room  A/P: 28 y.o. G1P0000 [redacted]w[redacted]d presenting for contractions, with cat 2 tracing 1. Labor: Latent phase, minimal progression. Difficulty in titrating pitocin r/t fetal heart tracing.   2. FWB: cat 2 3. Pain: Coping well s/p epidural 4. GBS neg 5. Counseled patient on concern for FHT tracing with recurrent lates and prolonged in MAU. While at bedside, fetus with a prolonged deceleration which eventually recovered 2/2 position changes from right lateral to left lateral to hands and knees, an LR bolus and terbutaline. Dr. Earlene Plater and [name] to bedside, recommended proceeding with CS birth and patient agreed with this plan. Nursing staff preparing patient for OR.  Anticipate CS birth  [name], CNM 9:59 PM

## 2023-07-18 NOTE — Anesthesia Preprocedure Evaluation (Addendum)
Anesthesia Evaluation  Patient identified by MRN, date of birth, ID band Patient awake    Reviewed: Allergy & Precautions, H&P , NPO status , Patient's Chart, lab work & pertinent test results  Airway Mallampati: II  TM Distance: >3 FB Neck ROM: Full    Dental no notable dental hx.    Pulmonary neg pulmonary ROS   Pulmonary exam normal breath sounds clear to auscultation       Cardiovascular negative cardio ROS Normal cardiovascular exam Rhythm:Regular Rate:Normal     Neuro/Psych negative neurological ROS  negative psych ROS   GI/Hepatic negative GI ROS, Neg liver ROS,,,  Endo/Other  negative endocrine ROS    Renal/GU negative Renal ROS  negative genitourinary   Musculoskeletal negative musculoskeletal ROS (+)    Abdominal   Peds negative pediatric ROS (+)  Hematology negative hematology ROS (+)   Anesthesia Other Findings   Reproductive/Obstetrics (+) Pregnancy                             Anesthesia Physical Anesthesia Plan  ASA: 2 and emergent  Anesthesia Plan: Epidural   Post-op Pain Management:    Induction:   PONV Risk Score and Plan:   Airway Management Planned:   Additional Equipment:   Intra-op Plan:   Post-operative Plan:   Informed Consent:   Plan Discussed with:   Anesthesia Plan Comments:        Anesthesia Quick Evaluation

## 2023-07-18 NOTE — Transfer of Care (Signed)
 Immediate Anesthesia Transfer of Care Note  Patient: Margaret Eaton  Procedure(s) Performed: CESAREAN SECTION  Patient Location: PACU  Anesthesia Type:General  Level of Consciousness: awake, alert , and oriented  Airway & Oxygen Therapy: Patient Spontanous Breathing  Post-op Assessment: Report given to RN and Post -op Vital signs reviewed and stable  Post vital signs: Reviewed and stable  Last Vitals:  Vitals Value Taken Time  BP 130/83 07/18/23 2246  Temp 36.3 C 07/18/23 2245  Pulse 109 07/18/23 2256  Resp 25 07/18/23 2256  SpO2 97 % 07/18/23 2256  Vitals shown include unfiled device data.  Last Pain:  Vitals:   07/18/23 2245  TempSrc: Axillary  PainSc:          Complications: No notable events documented.

## 2023-07-18 NOTE — Discharge Summary (Cosign Needed Addendum)
 Postpartum Discharge Summary     Patient Name: Margaret Eaton DOB: 09/24/1995 MRN: [id number]  Date of admission: 07/18/2023 Delivery date:07/18/2023 Delivering provider:  Bing Date of discharge: 07/21/2023  Admitting diagnosis: Pregnancy at 39/4. FHR prolonged deceleration in OB triage. Early labor. Rh neg    Discharge diagnosis: Term Pregnancy Delivered and pLTCS                                               Post partum procedures: n/a  Augmentation: Pitocin Complications: Fetal intolerance in early labor. Moderate meconium. [facility] course: patient admitted from triage for 39wks and fetal decel noted. On pitocin of 2mU, she had recurrence of decelerations and a prolonged. Given fetal intolerance, patient was amenable to c/s. In OR, patient had FHR in the 60s so stat was called and she had a LTCS under general (EBL 1L) and baby did well.  Details of operation can be found in separate operative Note.  Patient had a postpartum course that was uncomplicated. She is ambulating, tolerating a regular diet, passing flatus, and urinating well.  Patient is discharged home in stable condition on 07/21/23.      Newborn Data: Birth date:07/18/2023 Birth time:9:32 PM Gender:Female Living status:Living Apgars:9 ,9  Weight:2580 g                               Magnesium Sulfate received: No BMZ received: No Rhophylac:Yes given 2/22 1642 MMR:N/A Transfusion:No  Immunizations administered: Immunization History  Administered Date(s) Administered   DTaP 08/16/1996, 11/05/1996, 12/22/1996, 12/09/1997   H1N1 04/20/2008   HIB (PRP-OMP) 08/16/1996, 11/05/1996, 12/22/1996, 12/09/1997   HPV Quadrivalent 03/20/2007, 08/06/2007, 03/08/2009   Hepatitis A 04/11/2010   Hepatitis B 1996-07-19, 08/16/1996, 12/22/1996   Hpv-Unspecified 03/20/2007, 08/06/2007   IPV 08/16/1996, 11/05/1996, 06/15/1997   Influenza Split 05/07/2011, 02/19/2013   Influenza Whole 03/20/2007, 02/18/2008,  04/20/2008   Influenza,inj,Quad PF,6+ Mos 02/19/2013, 03/15/2015, 04/08/2017   MMR 06/15/1997, 01/11/2015   Meningococcal Conjugate 04/11/2010, 01/11/2015   PPD Test 12/01/2015, 01/05/2016   Td 01/18/2008   Tdap 01/18/2008, 07/20/2023   Varicella 06/15/1997, 01/11/2015    Physical exam  Vitals:   07/20/23 0520 07/20/23 1415 07/20/23 2105 07/21/23 0528  BP: 102/65 113/64 109/68 106/66  Pulse: 86 (!) 113 68 96  Resp: 18 18 18 18   Temp: 98.2 F (36.8 C) 98.3 F (36.8 C) 98.8 F (37.1 C) 98.5 F (36.9 C)  TempSrc:  Oral Oral Oral  SpO2: 99% 100% 100% 99%  Weight:      Height:       General: alert, cooperative, and no distress Lochia: appropriate Uterine Fundus: firm Incision: Healing well with no significant drainage, No significant erythema, Dressing is clean, dry, and intact DVT Evaluation: No evidence of DVT seen on physical exam.  Labs: Lab Results  Component Value Date   WBC 11.8 (H) 07/19/2023   HGB 8.4 (L) 07/19/2023   HCT 25.2 (L) 07/19/2023   MCV 93.0 07/19/2023   PLT 110 (L) 07/19/2023      Latest Ref Rng & Units 07/19/2023    4:31 AM  CMP  Creatinine 0.44 - 1.00 mg/dL 8.29    Edinburgh Score:    07/19/2023    4:57 PM  Edinburgh Postnatal Depression Scale Screening Tool  I have been able  to laugh and see the funny side of things. 0  I have looked forward with enjoyment to things. 0  I have blamed myself unnecessarily when things went wrong. 0  I have been anxious or worried for no good reason. 0  I have felt scared or panicky for no good reason. 0  Things have been getting on top of me. 0  I have been so unhappy that I have had difficulty sleeping. 0  I have felt sad or miserable. 0  I have been so unhappy that I have been crying. 0  The thought of harming myself has occurred to me. 0  Edinburgh Postnatal Depression Scale Total 0      After visit meds:  Allergies as of 07/21/2023       Reactions   Penicillins Hives   Face and neck         Medication List     TAKE these medications    acetaminophen 325 MG tablet Commonly known as: TYLENOL Take 2 tablets (650 mg total) by mouth every 6 (six) hours.   ferrous sulfate 325 (65 FE) MG tablet Take 1 tablet (325 mg total) by mouth every other day.   ibuprofen 600 MG tablet Commonly known as: ADVIL Take 1 tablet (600 mg total) by mouth every 6 (six) hours.   oxyCODONE 5 MG immediate release tablet Commonly known as: Oxy IR/ROXICODONE Take 1-2 tablets (5-10 mg total) by mouth every 6 (six) hours as needed for moderate pain (pain score 4-6) or severe pain (pain score 7-10).   prenatal vitamin w/FE, FA 27-1 MG Tabs tablet Take 1 tablet by mouth daily at 12 noon.   senna-docusate 8.6-50 MG tablet Commonly known as: Senokot-S Take 2 tablets by mouth 2 (two) times daily as needed for mild constipation.         Discharge home in stable condition Infant Feeding: Bottle and Breast Infant Disposition:home with mother Discharge instruction: per After Visit Summary and Postpartum booklet. Activity: Advance as tolerated. Pelvic rest for 6 weeks.  Diet: routine diet Anticipated Birth Control:  declined Postpartum Appointment:4 weeks Additional Postpartum F/U: Incision check 1 week Future Appointments: Future Appointments  Date Time Provider Department Center  07/23/2023  3:15 PM [facility] US2 [facility]  07/23/2023  3:55 PM Celedonio Savage, MD [facility]  07/28/2023  7:15 AM [facility] ROOM [facility] None   Follow up Visit: [X]  incision check request sent 2/21     07/21/2023 Hessie Dibble, MD

## 2023-07-18 NOTE — Anesthesia Procedure Notes (Signed)
 Epidural Patient location during procedure: OB Start time: 07/18/2023 7:44 PM End time: 07/18/2023 7:59 PM  Staffing Anesthesiologist: Lowella Curb, MD Performed: anesthesiologist   Preanesthetic Checklist Completed: patient identified, IV checked, site marked, risks and benefits discussed, surgical consent, monitors and equipment checked, pre-op evaluation and timeout performed  Epidural Patient position: sitting Prep: ChloraPrep Patient monitoring: heart rate, cardiac monitor, continuous pulse ox and blood pressure Approach: midline Location: L2-L3 Injection technique: LOR saline  Needle:  Needle type: Tuohy  Needle gauge: 17 G Needle length: 9 cm Needle insertion depth: 5 cm Catheter type: closed end flexible Catheter size: 20 Guage Catheter at skin depth: 9 cm Test dose: negative  Assessment Events: blood not aspirated, injection not painful, no injection resistance, no paresthesia and negative IV test  Additional Notes Reason for block:procedure for pain

## 2023-07-18 NOTE — Progress Notes (Signed)
 Called by RN for prolonged declaration @ 1720 on NST. Patient was in hands knee position at the time I entered with tetanic contractions palpated. Unable to resolve [facility] with positional changes by RN.  Sterile vaginal exam for scalp stimulation  performed with patient consent (2/80-2)  VTX and terbutaline 2.5 mg IM x 1 ordered by Dr Shawnie Pons [facility] Attending), IVF Bolus ordered and FHR returned to baseline with interventions.  PLAN: Admit to LDR @ 39.4 GA for FHRT as previously d/w Dr Shawnie Pons and report was given to Tyler Aas  ( CNM)

## 2023-07-18 NOTE — MAU Note (Signed)
.  Margaret Eaton is a 28 y.o. at [redacted]w[redacted]d here in MAU reporting stronger ctxs since 0100 than when she was here Thurs evening for labor check..Reports good FM and denies LOF or VB  LMP: n/a Onset of complaint: yesterday Pain score: 8 Vitals:   07/18/23 0448  Pulse: 78  Resp: 18  Temp: 98.1 F (36.7 C)  SpO2: 100%     FHT: 123  Lab orders placed from triage: labor eval

## 2023-07-18 NOTE — MAU Provider Note (Signed)
 Labor Check  S: [name] is a 28 y.o. G1P0000 at [redacted]w[redacted]d  who presents to MAU today for labor evaluation. Denies VB, LOF, DFM.  Cervical exam by RN:  Dilation: 1 Effacement (%): 70 Station: -2 Presentation: Vertex Exam by:: Quintella Baton, RN  Fetal Monitoring: Baseline: 125 Variability: moderate Accelerations: present Decelerations: absent Contractions: 3-4  MDM Discussed patient with RN. NST reviewed. Reactive. BPP 8/8 yesterday.  A: SIUP at [redacted]w[redacted]d  False labor  P: Discharge home per patient request. Declines recheck Labor precautions and kick counts included in AVS Patient to follow-up with primary OB as scheduled  Patient may return to MAU as needed or when in labor   Joanne Gavel, MD 07/18/2023 5:19 AM

## 2023-07-18 NOTE — MAU Note (Signed)
..  Margaret Eaton is a 28 y.o. at [redacted]w[redacted]d here in MAU reporting: contractions every two minutes apart that have increased in frequency and intensity since a membrane sweep this morning.   Endorses +FM, denies LOF, reports some bloody show.   Pain score: 10/10 Vitals:   07/18/23 1611  BP: 122/77  Pulse: 79  Resp: 18  Temp: 98.2 F (36.8 C)     FHT:125 Lab orders placed from triage:  RN labor eval, UA

## 2023-07-19 DIAGNOSIS — Z3A39 39 weeks gestation of pregnancy: Secondary | ICD-10-CM | POA: Diagnosis not present

## 2023-07-19 DIAGNOSIS — O4103X0 Oligohydramnios, third trimester, not applicable or unspecified: Secondary | ICD-10-CM | POA: Diagnosis not present

## 2023-07-19 LAB — CBC
HCT: 26.5 % — ABNORMAL LOW (ref 36.0–46.0)
HCT: 25.2 % — ABNORMAL LOW (ref 36.0–46.0)
Hemoglobin: 8.9 g/dL — ABNORMAL LOW (ref 12.0–15.0)
Hemoglobin: 8.4 g/dL — ABNORMAL LOW (ref 12.0–15.0)
MCH: 31.1 pg (ref 26.0–34.0)
MCH: 31 pg (ref 26.0–34.0)
MCHC: 33.6 g/dL (ref 30.0–36.0)
MCHC: 33.3 g/dL (ref 30.0–36.0)
MCV: 92.7 fL (ref 80.0–100.0)
MCV: 93 fL (ref 80.0–100.0)
Platelets: 93 10*3/uL — ABNORMAL LOW (ref 150–400)
Platelets: 110 10*3/uL — ABNORMAL LOW (ref 150–400)
RBC: 2.86 MIL/uL — ABNORMAL LOW (ref 3.87–5.11)
RBC: 2.71 MIL/uL — ABNORMAL LOW (ref 3.87–5.11)
RDW: 12.9 % (ref 11.5–15.5)
RDW: 13.2 % (ref 11.5–15.5)
WBC: 10.5 10*3/uL (ref 4.0–10.5)
WBC: 11.8 10*3/uL — ABNORMAL HIGH (ref 4.0–10.5)
nRBC: 0 % (ref 0.0–0.2)
nRBC: 0 % (ref 0.0–0.2)

## 2023-07-19 LAB — CREATININE, SERUM
Creatinine, Ser: 0.83 mg/dL (ref 0.44–1.00)
GFR, Estimated: >60 mL/min (ref >60)

## 2023-07-19 LAB — RPR: RPR Ser Ql: NONREACTIVE

## 2023-07-19 MED ORDER — GABAPENTIN 300 MG PO CAPS: 300.0000 mg | ORAL_CAPSULE | Freq: Two times a day (BID) | ORAL | Status: DC

## 2023-07-19 MED ORDER — FAMOTIDINE 20 MG PO TABS
40.0000 mg | ORAL_TABLET | Freq: Once | ORAL | Status: AC
  Administered 2023-07-19: 40 mg via ORAL
  Filled 2023-07-19: qty 2

## 2023-07-19 MED ORDER — IBUPROFEN 600 MG PO TABS
600.0000 mg | ORAL_TABLET | Freq: Three times a day (TID) | ORAL | Status: DC
  Administered 2023-07-19 – 2023-07-20 (×3): 600 mg via ORAL
  Filled 2023-07-19 (×3): qty 1

## 2023-07-19 MED ORDER — PRENATAL MULTIVITAMIN CH
1.0000 | ORAL_TABLET | Freq: Every day | ORAL | Status: DC
  Administered 2023-07-19 – 2023-07-21 (×3): 1 via ORAL
  Filled 2023-07-19 (×3): qty 1

## 2023-07-19 MED ORDER — IBUPROFEN 600 MG PO TABS: 600.0000 mg | ORAL_TABLET | Freq: Four times a day (QID) | ORAL | Status: DC

## 2023-07-19 MED ORDER — DIPHENHYDRAMINE HCL 25 MG PO CAPS
25.0000 mg | ORAL_CAPSULE | Freq: Four times a day (QID) | ORAL | Status: DC | PRN
  Administered 2023-07-19: 25 mg via ORAL

## 2023-07-19 MED ORDER — MENTHOL 3 MG MT LOZG
1.0000 | LOZENGE | OROMUCOSAL | Status: DC | PRN
  Administered 2023-07-20: 3 mg via ORAL
  Filled 2023-07-19: qty 9

## 2023-07-19 MED ORDER — COCONUT OIL OIL
1.0000 | TOPICAL_OIL | Status: DC | PRN
  Administered 2023-07-21: 1 via TOPICAL

## 2023-07-19 MED ORDER — SIMETHICONE 80 MG PO CHEW: 80.0000 mg | CHEWABLE_TABLET | ORAL | Status: DC | PRN

## 2023-07-19 MED ORDER — OXYCODONE HCL 5 MG PO TABS
5.0000 mg | ORAL_TABLET | Freq: Once | ORAL | Status: AC
  Administered 2023-07-19: 5 mg via ORAL
  Filled 2023-07-19: qty 1

## 2023-07-19 MED ORDER — ENOXAPARIN SODIUM 40 MG/0.4ML IJ SOSY
40.0000 mg | PREFILLED_SYRINGE | INTRAMUSCULAR | Status: DC
Start: 2023-07-19 — End: 2023-07-21
  Administered 2023-07-19 – 2023-07-20 (×2): 40 mg via SUBCUTANEOUS
  Filled 2023-07-19 (×2): qty 0.4

## 2023-07-19 MED ORDER — SIMETHICONE 80 MG PO CHEW
80.0000 mg | CHEWABLE_TABLET | Freq: Three times a day (TID) | ORAL | Status: DC
  Administered 2023-07-19 – 2023-07-21 (×6): 80 mg via ORAL
  Filled 2023-07-19 (×8): qty 1

## 2023-07-19 MED ORDER — KETOROLAC TROMETHAMINE 30 MG/ML IJ SOLN
30.0000 mg | Freq: Four times a day (QID) | INTRAMUSCULAR | Status: DC
  Administered 2023-07-19: 30 mg via INTRAVENOUS
  Filled 2023-07-19: qty 1

## 2023-07-19 MED ORDER — DIPHENHYDRAMINE HCL 50 MG/ML IJ SOLN
25.0000 mg | Freq: Once | INTRAMUSCULAR | Status: AC
  Administered 2023-07-19: 25 mg via INTRAVENOUS
  Filled 2023-07-19: qty 1

## 2023-07-19 MED ORDER — SENNOSIDES-DOCUSATE SODIUM 8.6-50 MG PO TABS: 2.0000 | ORAL_TABLET | Freq: Every evening | ORAL | Status: DC | PRN

## 2023-07-19 MED ORDER — ENOXAPARIN SODIUM 40 MG/0.4ML IJ SOSY
40.0000 mg | PREFILLED_SYRINGE | INTRAMUSCULAR | Status: DC
  Filled 2023-07-19: qty 0.4

## 2023-07-19 MED ORDER — WITCH HAZEL-GLYCERIN EX PADS: 1.0000 | MEDICATED_PAD | CUTANEOUS | Status: DC | PRN

## 2023-07-19 MED ORDER — OXYCODONE HCL 5 MG PO TABS
5.0000 mg | ORAL_TABLET | Freq: Four times a day (QID) | ORAL | Status: DC | PRN
  Administered 2023-07-19: 5 mg via ORAL
  Administered 2023-07-20 – 2023-07-21 (×5): 10 mg via ORAL
  Filled 2023-07-19 (×6): qty 2

## 2023-07-19 MED ORDER — RHO D IMMUNE GLOBULIN 1500 UNIT/2ML IJ SOSY
300.0000 ug | PREFILLED_SYRINGE | Freq: Once | INTRAMUSCULAR | Status: AC
  Administered 2023-07-19: 300 ug via INTRAVENOUS
  Filled 2023-07-19: qty 2

## 2023-07-19 MED ORDER — OXYTOCIN-SODIUM CHLORIDE 30-0.9 UT/500ML-% IV SOLN
2.5000 [IU]/h | INTRAVENOUS | Status: AC
  Administered 2023-07-19: 2.5 [IU]/h via INTRAVENOUS

## 2023-07-19 MED ORDER — GABAPENTIN 300 MG PO CAPS
300.0000 mg | ORAL_CAPSULE | Freq: Two times a day (BID) | ORAL | Status: DC
  Administered 2023-07-19 – 2023-07-21 (×5): 300 mg via ORAL
  Filled 2023-07-19 (×5): qty 1

## 2023-07-19 MED ORDER — DIBUCAINE (PERIANAL) 1 % EX OINT: 1.0000 | TOPICAL_OINTMENT | CUTANEOUS | Status: DC | PRN

## 2023-07-19 NOTE — Progress Notes (Signed)
 RN called to discuss medications for thrombocytopenia, with pt PLTs 93 at 0430 this morning.  Ok to give normal doses of Tylenol and ibuprofen, and oxycodone, but will hold Lovenox until repeat CBC. If platelets stable, will give Lovenox tonight.

## 2023-07-19 NOTE — Progress Notes (Signed)
 Writer called dr Earlene Plater due to patient states that she is having an reaction to Toradol.Denies any sob or difficulty with swallowing. Vital are wnl, Oxygen is 100% on room air. Dr. Earlene Plater gave a verbal order for one dose of iv benadryl 25mg . Will continue to monitor for safety.

## 2023-07-19 NOTE — Progress Notes (Signed)
 Provider states to give patient Lovenox injection states that her platelets are stable now.

## 2023-07-19 NOTE — Anesthesia Postprocedure Evaluation (Signed)
 Anesthesia Post Note  Patient: Margaret Eaton  Procedure(s) Performed: CESAREAN SECTION     Patient location during evaluation: PACU Anesthesia Type: Epidural Level of consciousness: awake and alert Pain management: pain level controlled Vital Signs Assessment: post-procedure vital signs reviewed and stable Respiratory status: spontaneous breathing, nonlabored ventilation and respiratory function stable Cardiovascular status: blood pressure returned to baseline and stable Postop Assessment: no apparent nausea or vomiting Anesthetic complications: no   No notable events documented.  Last Vitals:  Vitals:   07/19/23 0105 07/19/23 0200  BP: 121/85 (!) 144/87  Pulse: 87 74  Resp: 18 18  Temp: 37.1 C 37.1 C  SpO2: 99% 100%    Last Pain:  Vitals:   07/19/23 0205  TempSrc:   PainSc: 6    Pain Goal:                   Lowella Curb

## 2023-07-19 NOTE — Lactation Note (Signed)
 This note was copied from a baby's chart. Lactation Consultation Note  Patient Name: Margaret Eaton [id number] Date: 07/19/2023 Age:27 hours Reason for consult: Initial assessment;Primapara;Infant < 6lbs;Term New mom has lots of questions. Mom stated she wasn't able to BF tonight because she can't move well, has itching and pain. Mom stated she may try to BF today sometime. Mom wanted LC to look at her breast and show her how to hand express. Mom's breast has pitting edema, breast tissue hard and thick feeling. Nipples, one is flat, one is inverted. Unable to see boarders of nipples d/t edema. Mom had piercing in the past. Baby wouldn't be able to latch at this time and breast aren't a candidate for NS. Discussed w/mom pumping and bottle feeding for now until the edema in her breast lessens. Mom stated she would prefer to pump and bottle anyway. Mom has 1 DEBP and one portable one at home. Shells given for mom to wear today w/bra. Mom agrees to pump. Mom shown how to use DEBP & how to disassemble, clean, & reassemble parts. Pump every 3 hrs. Mom pumped w/glistening to flange. Gave mom information of formula feeding amounts and reviewed. Mom stated she would like all of this information reviewed again today. Reported to RN.  Maternal Data Has patient been taught Hand Expression?: No Does the patient have breastfeeding experience prior to this delivery?: No  Feeding Mother's Current Feeding Choice: Breast Milk and Formula Nipple Type: Slow - flow  LATCH Score Latch:  (unable to latch to breast)     Type of Nipple: Inverted  Comfort (Breast/Nipple): Filling, red/small blisters or bruises, mild/mod discomfort (breast are hard and has pitting edema. non-compressible)         Lactation Tools Discussed/Used Tools: Pump;Shells Breast pump type: Double-Electric Breast Pump Pump Education: Setup, frequency, and cleaning;Milk Storage Reason for Pumping: breast edema Pumping frequency:  q 3hr Pumped volume: 0 mL  Interventions Interventions: Shells;DEBP;Education;LC Services brochure  Discharge    Consult Status Consult Status: Follow-up Date: 07/19/23 Follow-up type: In-patient    Charyl Dancer 07/19/2023, 5:52 AM

## 2023-07-19 NOTE — Progress Notes (Signed)
 Writer called Dr. Hyacinth Meeker anesthesiologist  just to make sure it was ok to give the oxycodone 5mg  for the patient even though she had a c/s @2132  tonight. He states that it is ok to give,

## 2023-07-19 NOTE — Progress Notes (Signed)
 Pt requested placenta after transferred to [facility]. Orders given in OR to send placenta to pathology for further testing. Per note by Lamont Snowball, CNM at 3:34am, CNM obtained placenta from placenta refrigerator and given to patient. Confirmed this in person with CNM. Updated pathology report sheet to reflect this. Order to surgical pathology discontinued.

## 2023-07-19 NOTE — Progress Notes (Signed)
 Provider is at bedside per patients request.

## 2023-07-19 NOTE — Progress Notes (Addendum)
 Patient states that she does not want to get up at this time. States that she would like to rest.

## 2023-07-19 NOTE — Lactation Note (Signed)
 This note was copied from a baby's chart. Lactation Consultation Note  Patient Name: Margaret Eaton Date: 07/19/2023 Age:27 hours Reason for consult: Follow-up assessment;Primapara;1st time breastfeeding;Term;Infant < 6lbs  P1- MOB reports that she has been following the previous LC's recommendation of pumping and using shells to bring down the areola swelling. MOB reports that the edema has gone down some since yesterday. LC provided MOB with coconut oil and demonstrated how to perform reserve pressuring. LC encouraged MOB to do this every few hour to help with the edema as well. LC encouraged MOB to continue pumping every 3 hrs and using the shells as needed. MOB verbalized understanding and agreement. MOB denied having any questions or concerns at this time.  Maternal Data Has patient been taught Hand Expression?: Yes Does the patient have breastfeeding experience prior to this delivery?: No  Feeding Mother's Current Feeding Choice: Breast Milk  Lactation Tools Discussed/Used Tools: Pump;Flanges;Coconut oil;Shells Flange Size: 21 Breast pump type: Double-Electric Breast Pump;Manual Pump Education: Setup, frequency, and cleaning;Milk Storage Reason for Pumping: LPI/low birth weight feeding plan Pumping frequency: 15-20 min every 3 hrs  Interventions Interventions: Breast feeding basics reviewed;Breast massage;Hand express;Reverse pressure;Expressed milk;Coconut oil;Shells;Hand pump;DEBP;Education;LC Services brochure;LPT handout/interventions  Discharge Discharge Education: Warning signs for feeding baby;Engorgement and breast care Pump: DEBP;Hands Free;Manual;Personal  Consult Status Consult Status: Follow-up Date: 07/20/23 Follow-up type: In-patient    Dema Severin BS, IBCLC 07/19/2023, 5:12 PM

## 2023-07-19 NOTE — Progress Notes (Signed)
 Patient is requesting pain medication writer called Dr. Earlene Plater to see what the patient can have, Provider states that anesthesiologist states that patient can have 5mg  of oxycodone even though she is a cs.will continue to monitor for safety.

## 2023-07-19 NOTE — Progress Notes (Addendum)
 POSTPARTUM PROGRESS NOTE  Subjective: Margaret Eaton is a 28 y.o. G1P1001 s/p LTCS at [redacted]w[redacted]d.  She reports she is doing well. No acute events overnight. She has yet to ambulate, but no problems with voiding or po intake. Denies nausea or vomiting. She has not passed flatus. Pain is moderately controlled.  Lochia is appropriate.  Objective: Blood pressure 125/72, pulse 85, temperature 98 F (36.7 C), temperature source Oral, resp. rate 18, height 5\' 2"  (1.575 m), weight 60.8 kg, last menstrual period 10/09/2022, SpO2 100%, unknown if currently breastfeeding.  Physical Exam:  General: alert, cooperative and no distress Chest: no respiratory distress Abdomen: soft, non-tender  Uterine Fundus: firm, appropriately tender Extremities: No calf swelling or tenderness  trace edema  Recent Labs    07/18/23 1830  HGB 11.6*  HCT 35.4*    Assessment/Plan: Margaret Eaton is a 28 y.o. G1P1001 s/p LTCS at [redacted]w[redacted]d for FITL.  Routine Postpartum Care: Doing well, pain well-controlled.  -- Continue routine care, lactation support  -- Contraception: declines -- Feeding: breast and formula - Patient concerned that she had a reaction to Toradol. Has never had reaction to ibuprofen in the past. Counseled itching likely 2/2 duramorph at CS. Will trial pepcid 40mg  PO for itching as it was unrelieved s/p benadryl. Consider Vistaril in event Pepcid not beneficial.  - Change from Toradol to ibuprofen PO 2/2 patient concern of reaction. - Provided patient placenta from L&D per patient request. In patient furnished cooler bag with ice. Patient voices understanding that it is not allowed on MB unit.   Dispo: Plan for discharge 07/21/23.  Lamont Snowball, MSN, CNM, RNC-OB Certified Nurse Midwife, [facility] 07/19/2023 4:10 AM

## 2023-07-20 LAB — RH IG WORKUP (INCLUDES ABO/RH)
Fetal Screen: NEGATIVE
Gestational Age(Wks): 39
Unit division: 0

## 2023-07-20 LAB — GROUP A STREP BY PCR: Group A Strep by PCR: NOT DETECTED

## 2023-07-20 MED ORDER — PHENOL 1.4 % MT LIQD: 1.0000 | OROMUCOSAL | Status: DC | PRN

## 2023-07-20 MED ORDER — TETANUS-DIPHTH-ACELL PERTUSSIS 5-2.5-18.5 LF-MCG/0.5 IM SUSY
0.5000 mL | PREFILLED_SYRINGE | Freq: Once | INTRAMUSCULAR | Status: AC
  Administered 2023-07-20: 0.5 mL via INTRAMUSCULAR
  Filled 2023-07-20: qty 0.5

## 2023-07-20 MED ORDER — ACETAMINOPHEN 325 MG PO TABS
650.0000 mg | ORAL_TABLET | Freq: Four times a day (QID) | ORAL | Status: DC
  Administered 2023-07-20 – 2023-07-21 (×4): 650 mg via ORAL
  Filled 2023-07-20 (×4): qty 2

## 2023-07-20 MED ORDER — IBUPROFEN 600 MG PO TABS
600.0000 mg | ORAL_TABLET | Freq: Four times a day (QID) | ORAL | Status: DC
  Administered 2023-07-20 – 2023-07-21 (×4): 600 mg via ORAL
  Filled 2023-07-20 (×4): qty 1

## 2023-07-20 NOTE — Progress Notes (Signed)
 Patient is struggling with pain. There is no order for scheduled tylenol. Ibuprofen is ordered for every 8 hours and oxycodone is ordered for every 6 hours. Called Dr. Donavan Foil and requested that Tylenol be added and ibuprofen and oxycodone's frequency be changed to be given more frequently. Dr. Donavan Foil ordered 650mg  tylenol every 6 hours and ibuprofen order to be changed to every 6 hours. Dr. Donavan Foil ordered to keep oxycodone order the same. Earl Gala, [facility] [place]

## 2023-07-20 NOTE — Progress Notes (Signed)
 POSTPARTUM PROGRESS NOTE  POD #2  Subjective:  Margaret Eaton is a 28 y.o. G1P1001 s/p primary LTCS at [redacted]w[redacted]d.   No acute events overnight. She reports she is doing well. She denies any problems with ambulating, voiding or po intake. Denies nausea or vomiting. She has not passed flatus. Pain is well controlled.  Lochia is normal.  Objective: Blood pressure 113/64, pulse (!) 113, temperature 98.3 F (36.8 C), temperature source Oral, resp. rate 18, height 5\' 2"  (1.575 m), weight 60.8 kg, last menstrual period 10/09/2022, SpO2 100%, unknown if currently breastfeeding.  Physical Exam:  General: alert, cooperative and no distress Chest: no respiratory distress, CTA bilaterally Heart:regular rate and rhythm Abdomen: soft, nontender, nondistended, positive bowel sounds Uterine Fundus: firm, appropriately tender DVT Evaluation: No calf swelling or tenderness Extremities: trace edema Skin: warm, dry; incision clean/dry/intact w/ honeycomb dressing in place  Recent Labs    07/19/23 0431 07/19/23 1837  HGB 8.9* 8.4*  HCT 26.5* 25.2*    Assessment/Plan: Margaret Eaton is a 28 y.o. G1P1001 s/p primary LTCS at [redacted]w[redacted]d for fetal intolerance to labor.  POD#2 -  Contraception: declined Feeding: breast/bottle Pt advised to increase ambulation to aid in return of bowel function. Anticipate discharge 07/21/23   LOS: 2 days   Mariel Aloe, Md Faculty Attending, Center for [facility] 07/20/2023, 5:55 PM

## 2023-07-20 NOTE — Lactation Note (Addendum)
 This note was copied from a baby's chart. Lactation Consultation Note  Patient Name: Margaret Eaton RUEAV'W Date: 07/20/2023 Age:27 hours Reason for consult: Exclusive pumping and bottle feeding;Follow-up assessment;Term;Infant < 6lbs (Infant with -1.75% weight loss) LC observed MOB needs 18 mm breast flange, MOB has severe engorgement with areola pitting edema in both breast. LC made hands free bra, and gave MOB ice for both breast. MOB will continue to apply ice, massage breast and pump every 3 hours,unless she is feeling discomfort and will pump earlier to alleviate pain in breast. MOB is not latching infant at the breast , she is pumping only and formula feeding infant this is her feeding choice. MOB informed LC she had nipple piercings that lead to her having keloids on both breast. MOB was starting express small amount of colostrum as LC left the room. MOB knows to call for further assistance if needed.  Will wear breast shells in hands free bra when not sleeping, Infant is receiving 15-30 mls of 22 kcal per feeding today.  Current plan: 1- Pump every 3 hours for 15 minutes. 2- Apply ice until areola edema and engorgement is resolved. 3- Ask for help if needed.  4- Continue to feed infant according to LPTI feeding plan and offer any EBM first before formula. MOB knows her EBM is safe for 4 hours at room temperature whereas formula once open must be used within 1 hour.   Maternal Data    Feeding Mother's Current Feeding Choice: Breast Milk and Formula Nipple Type: Slow - flow  LATCH Score                    Lactation Tools Discussed/Used Tools: Flanges;Pump Flange Size: 18 Pump Education: Setup, frequency, and cleaning;Milk Storage Pumping frequency: MOB will continue to use the DEBP every 3 hours for 15 minutes on inital setting.  Interventions Interventions: Breast compression;Expressed milk;Shells;Coconut oil;DEBP;Education;CDC Guidelines for Breast Pump  Cleaning;Guidelines for Milk Supply and Pumping Schedule Handout  Discharge    Consult Status Consult Status: Follow-up Date: 07/21/23 Follow-up type: In-patient    Frederico Hamman 07/20/2023, 4:55 PM

## 2023-07-21 ENCOUNTER — Other Ambulatory Visit (HOSPITAL_COMMUNITY): Payer: Self-pay

## 2023-07-21 ENCOUNTER — Encounter (HOSPITAL_COMMUNITY): Payer: Self-pay | Admitting: Obstetrics and Gynecology

## 2023-07-21 MED ORDER — OXYCODONE HCL 5 MG PO TABS
5.0000 mg | ORAL_TABLET | Freq: Four times a day (QID) | ORAL | 0 refills | Status: DC | PRN
Start: 2023-07-21 — End: 2023-07-23
  Filled 2023-07-21: qty 10, 2d supply, fill #0

## 2023-07-21 MED ORDER — SENNOSIDES-DOCUSATE SODIUM 8.6-50 MG PO TABS
2.0000 | ORAL_TABLET | Freq: Two times a day (BID) | ORAL | 0 refills | Status: DC | PRN
  Filled 2023-07-21: qty 30, 8d supply, fill #0

## 2023-07-21 MED ORDER — FERROUS SULFATE 325 (65 FE) MG PO TABS
325.0000 mg | ORAL_TABLET | ORAL | 3 refills | Status: DC
  Filled 2023-07-21: qty 30, 60d supply, fill #0

## 2023-07-21 MED ORDER — IBUPROFEN 600 MG PO TABS
600.0000 mg | ORAL_TABLET | Freq: Four times a day (QID) | ORAL | 0 refills | Status: DC
  Filled 2023-07-21: qty 30, 8d supply, fill #0

## 2023-07-21 MED ORDER — ACETAMINOPHEN 325 MG PO TABS
650.0000 mg | ORAL_TABLET | Freq: Four times a day (QID) | ORAL | 0 refills | Status: DC
  Filled 2023-07-21: qty 30, 4d supply, fill #0

## 2023-07-21 MED ORDER — FERROUS SULFATE 325 (65 FE) MG PO TABS
325.0000 mg | ORAL_TABLET | ORAL | Status: DC
  Administered 2023-07-21: 325 mg via ORAL
  Filled 2023-07-21: qty 1

## 2023-07-21 NOTE — Lactation Note (Signed)
 This note was copied from a baby's chart. Lactation Consultation Note  Patient Name: Margaret Eaton [id number] Date: 07/21/2023 Age:27 hours Reason for consult: Follow-up assessment;Primapara;1st time breastfeeding;Term;Infant < 6lbs;Infant weight loss;Difficult latch;Exclusive pumping and bottle feeding (1 % weight loss. Difficult latch due to swelling. for now mom exclusively pumping) LC reviewed supply and demand and the importance of consistently pumping would the clock after the baby is settled to enhance the milk coming in . LC recommended using her DEBP instead of her hands free until her milk is well established. (  8 -10 times a day and make one of the pumps a power pump over 60 mins ( 20 mins on 10 mins off over 60 mins). LC reminded mom the volume will be up and down for several days and has the edema decreases she will notice more volume.  See below for details.    Maternal Data Has patient been taught Hand Expression?: Yes  Feeding Mother's Current Feeding Choice: Breast Milk and Formula Nipple Type: Slow - flow   Lactation Tools Discussed/Used Tools: Pump;Flanges Flange Size: 18;21 Breast pump type: Double-Electric Breast Pump;Manual Pump Education: Milk Storage;Other (comment);Setup, frequency, and cleaning (per mom has been pumping) Pumped volume:  (per mom getting clear milk small amount.)  Interventions Interventions: Breast feeding basics reviewed;Coconut oil;Hand pump;DEBP;Education;LC Services brochure;CDC Guidelines for Breast Pump Cleaning  Discharge Discharge Education: Engorgement and breast care;Warning signs for feeding baby;Outpatient recommendation;Other (comment) (after milk comes in tp call) Pump: Personal;DEBP;Hands Free;Manual  Consult Status Consult Status: Complete Date: 07/21/23    Margaret Eaton 07/21/2023, 10:30 AM

## 2023-07-23 ENCOUNTER — Other Ambulatory Visit: Payer: Self-pay | Admitting: Family Medicine

## 2023-07-23 ENCOUNTER — Other Ambulatory Visit: Payer: Medicaid Other

## 2023-07-23 ENCOUNTER — Other Ambulatory Visit: Payer: Self-pay

## 2023-07-23 ENCOUNTER — Encounter: Payer: Medicaid Other | Admitting: Family Medicine

## 2023-07-23 MED ORDER — OXYCODONE HCL 5 MG PO TABS: 5.0000 mg | ORAL_TABLET | Freq: Four times a day (QID) | ORAL | 0 refills | Status: DC | PRN

## 2023-07-23 NOTE — Telephone Encounter (Signed)
 Spoke with patient at newborn visit. Patient reports she was taking 10 mg Oxy as prescribed at the hospital but has run out. Discussed that she should have been taking 5 mg instead. Sent refill to patients request pharmacy in [place]. 10 tablets prescribed.

## 2023-07-26 DIAGNOSIS — Z419 Encounter for procedure for purposes other than remedying health state, unspecified: Secondary | ICD-10-CM | POA: Diagnosis not present

## 2023-07-28 ENCOUNTER — Other Ambulatory Visit (HOSPITAL_COMMUNITY): Payer: Self-pay

## 2023-07-28 ENCOUNTER — Inpatient Hospital Stay (HOSPITAL_COMMUNITY): Payer: Medicaid Other

## 2023-07-28 ENCOUNTER — Inpatient Hospital Stay (HOSPITAL_COMMUNITY): Admission: RE | Admit: 2023-07-28 | Payer: Medicaid Other | Source: Home / Self Care | Admitting: Internal Medicine

## 2023-07-29 ENCOUNTER — Other Ambulatory Visit: Payer: Self-pay

## 2023-07-29 ENCOUNTER — Ambulatory Visit: Payer: Medicaid Other

## 2023-07-29 VITALS — BP 111/67 | HR 75 | Ht 62.0 in | Wt 117.6 lb

## 2023-07-29 DIAGNOSIS — Z4889 Encounter for other specified surgical aftercare: Secondary | ICD-10-CM

## 2023-07-29 NOTE — Progress Notes (Signed)
 Incision Check Visit  Margaret Eaton is here for incision check following repeat c-section on 07/18/23.   Assessment: Incision is clean, dry, intact and well approximated. No s/s of infection.    Education: Reviewed good daily wound care and s/s of infection with patient. Advised patient to call our office with any questions or concerns.   Patient will follow up for her post partum visit on 08/28/23 at 1:15. Patient confirmed scheduled appointment.   Quintella Reichert, RN 07/29/2023  3:12 PM

## 2023-08-28 ENCOUNTER — Other Ambulatory Visit: Payer: Self-pay

## 2023-08-28 ENCOUNTER — Ambulatory Visit: Payer: Medicaid Other | Admitting: Family Medicine

## 2023-08-28 DIAGNOSIS — O26899 Other specified pregnancy related conditions, unspecified trimester: Secondary | ICD-10-CM

## 2023-08-28 DIAGNOSIS — Z98891 History of uterine scar from previous surgery: Secondary | ICD-10-CM | POA: Insufficient documentation

## 2023-08-28 DIAGNOSIS — Z30011 Encounter for initial prescription of contraceptive pills: Secondary | ICD-10-CM

## 2023-08-28 MED ORDER — NORGESTIMATE-ETH ESTRADIOL 0.25-35 MG-MCG PO TABS: 1.0000 | ORAL_TABLET | Freq: Every day | ORAL | 11 refills | Status: AC

## 2023-08-28 NOTE — Progress Notes (Signed)
    Post Partum Visit Note  Margaret Eaton is a 28 y.o. G66P1001 female who presents for a postpartum visit. She is 6 weeks postpartum following a low transverse cesarean .  I have fully reviewed the prenatal and intrapartum course. The delivery was at 39 gestational weeks.  Anesthesia:  epidural to general . Postpartum course has been uneventful. Baby is doing well. Baby is feeding by bottle - Similac Neosure. Bleeding no bleeding. Bowel function is normal. Bladder function is normal. Patient is not sexually active. Contraception method is none. Postpartum depression screening: negative.   The pregnancy intention screening data noted above was reviewed. Potential methods of contraception were discussed. The patient elected to proceed with No data recorded.    Health Maintenance Due  Topic Date Due   COVID-19 Vaccine (1 - 2024-25 season) Never done    The following portions of the patient's history were reviewed and updated as appropriate: allergies, current medications, past family history, past medical history, past social history, past surgical history, and problem list.  Review of Systems Pertinent items noted in HPI and remainder of comprehensive ROS otherwise negative.  Objective:  BP 116/79   Pulse 85   Wt 116 lb 1 oz (52.6 kg)   BMI 21.23 kg/m    General:  alert, cooperative, and appears stated age   Breasts:  not indicated  Lungs: Comfortalbe on room air  Wound well approximated incision, has healed up beautifully  GU exam:  not indicated        Assessment:   Postpartum care and examination  Encounter for initial prescription of contraceptive pills - Plan: norgestimate-ethinyl estradiol (ORTHO-CYCLEN) 0.25-35 MG-MCG tablet  History of cesarean delivery  Rh negative state in antepartum period   Normal postpartum exam.   Plan:   Essential components of care per ACOG recommendations:  1.  Mood and well being: Patient with negative depression screening today.  Reviewed local resources for support.  - Patient tobacco use? No.   - hx of drug use? No.    2. Infant care and feeding:  -Patient currently breastmilk feeding? No.  -Social determinants of health (SDOH) reviewed in EPIC. No concerns  3. Sexuality, contraception and birth spacing - Patient does not want a pregnancy in the next year.  Desired family size is unsure children.  - Reviewed reproductive life planning. Reviewed contraceptive methods based on pt preferences and effectiveness.  Patient desired Oral Contraceptive today.   - Discussed birth spacing of 18 months  4. Sleep and fatigue -Encouraged family/partner/community support of 4 hrs of uninterrupted sleep to help with mood and fatigue  5. Physical Recovery  - Discussed patients delivery and complications. She describes her labor as bad. - Patient had a C-section emergent. - Patient has urinary incontinence? No. - Patient is safe to resume physical and sexual activity  6.  Health Maintenance - HM due items addressed No - up todate - Last pap smear  Diagnosis  Date Value Ref Range Status  01/22/2023   Final   - Negative for intraepithelial lesion or malignancy (NILM)   Pap smear not done at today's visit.  -Breast Cancer screening indicated? No.   7. Chronic Disease/Pregnancy Condition follow up: None  - PCP follow up  Venora Maples, MD Center for [facility], [facility]

## 2023-09-06 DIAGNOSIS — Z419 Encounter for procedure for purposes other than remedying health state, unspecified: Secondary | ICD-10-CM | POA: Diagnosis not present

## 2023-09-29 ENCOUNTER — Encounter: Payer: Self-pay | Admitting: Family Medicine

## 2023-10-06 DIAGNOSIS — Z419 Encounter for procedure for purposes other than remedying health state, unspecified: Secondary | ICD-10-CM | POA: Diagnosis not present

## 2023-11-06 DIAGNOSIS — Z419 Encounter for procedure for purposes other than remedying health state, unspecified: Secondary | ICD-10-CM | POA: Diagnosis not present

## 2023-12-06 DIAGNOSIS — Z419 Encounter for procedure for purposes other than remedying health state, unspecified: Secondary | ICD-10-CM | POA: Diagnosis not present

## 2024-01-06 DIAGNOSIS — Z419 Encounter for procedure for purposes other than remedying health state, unspecified: Secondary | ICD-10-CM | POA: Diagnosis not present

## 2024-02-06 DIAGNOSIS — Z419 Encounter for procedure for purposes other than remedying health state, unspecified: Secondary | ICD-10-CM | POA: Diagnosis not present

## 2024-03-07 DIAGNOSIS — Z419 Encounter for procedure for purposes other than remedying health state, unspecified: Secondary | ICD-10-CM | POA: Diagnosis not present

## 2024-04-07 DIAGNOSIS — Z419 Encounter for procedure for purposes other than remedying health state, unspecified: Secondary | ICD-10-CM | POA: Diagnosis not present
